# Patient Record
Sex: Female | Born: 1995 | Race: White | Hispanic: No | Marital: Single | Smoking: Former smoker
Health system: Southern US, Community
[De-identification: ages and names within clinical notes are randomized; demographics above are authoritative.]

## PROBLEM LIST (undated history)

## (undated) DIAGNOSIS — R55 Syncope and collapse: Secondary | ICD-10-CM

## (undated) DIAGNOSIS — K5090 Crohn's disease, unspecified, without complications: Secondary | ICD-10-CM

## (undated) DIAGNOSIS — R569 Unspecified convulsions: Secondary | ICD-10-CM

## (undated) DIAGNOSIS — F419 Anxiety disorder, unspecified: Secondary | ICD-10-CM

## (undated) HISTORY — PX: WISDOM TOOTH EXTRACTION: SHX21

## (undated) HISTORY — DX: Syncope and collapse: R55

## (undated) HISTORY — PX: HIP SURGERY: SHX245

---

## 2019-05-21 ENCOUNTER — Emergency Department: Payer: BC Managed Care – PPO

## 2019-05-21 ENCOUNTER — Emergency Department
Admission: EM | Admit: 2019-05-21 | Discharge: 2019-05-21 | Disposition: A | Discharge status: Home / Self Care | Payer: BC Managed Care – PPO | Attending: Emergency Medicine | Admitting: Emergency Medicine

## 2019-05-21 ENCOUNTER — Other Ambulatory Visit: Payer: Self-pay

## 2019-05-21 DIAGNOSIS — F17210 Nicotine dependence, cigarettes, uncomplicated: Secondary | ICD-10-CM | POA: Insufficient documentation

## 2019-05-21 DIAGNOSIS — M25551 Pain in right hip: Secondary | ICD-10-CM | POA: Insufficient documentation

## 2019-05-21 DIAGNOSIS — R569 Unspecified convulsions: Secondary | ICD-10-CM | POA: Insufficient documentation

## 2019-05-21 LAB — CBC WITH DIFFERENTIAL/PLATELET
Abs Immature Granulocytes: 0.01 10*3/uL (ref 0.00–0.07)
Basophils Absolute: 0 10*3/uL (ref 0.0–0.1)
Basophils Relative: 1 %
Eosinophils Absolute: 0.1 10*3/uL (ref 0.0–0.5)
Eosinophils Relative: 2 %
HCT: 34.1 % — ABNORMAL LOW (ref 36.0–46.0)
Hemoglobin: 11.3 g/dL — ABNORMAL LOW (ref 12.0–15.0)
Immature Granulocytes: 0 %
Lymphocytes Relative: 39 %
Lymphs Abs: 1.7 10*3/uL (ref 0.7–4.0)
MCH: 30.8 pg (ref 26.0–34.0)
MCHC: 33.1 g/dL (ref 30.0–36.0)
MCV: 92.9 fL (ref 80.0–100.0)
Monocytes Absolute: 0.5 10*3/uL (ref 0.1–1.0)
Monocytes Relative: 10 %
Neutro Abs: 2.1 10*3/uL (ref 1.7–7.7)
Neutrophils Relative %: 48 %
Platelets: 250 10*3/uL (ref 150–400)
RBC: 3.67 MIL/uL — ABNORMAL LOW (ref 3.87–5.11)
RDW: 12.5 % (ref 11.5–15.5)
WBC: 4.4 10*3/uL (ref 4.0–10.5)
nRBC: 0 % (ref 0.0–0.2)

## 2019-05-21 LAB — BASIC METABOLIC PANEL
Anion gap: 9 (ref 5–15)
BUN: 11 mg/dL (ref 6–20)
CO2: 22 mmol/L (ref 22–32)
Calcium: 8.9 mg/dL (ref 8.9–10.3)
Chloride: 108 mmol/L (ref 98–111)
Creatinine, Ser: 0.78 mg/dL (ref 0.44–1.00)
GFR calc Af Amer: >60 mL/min (ref >60)
GFR calc non Af Amer: >60 mL/min (ref >60)
Glucose, Bld: 80 mg/dL (ref 70–99)
Potassium: 4.2 mmol/L (ref 3.5–5.1)
Sodium: 139 mmol/L (ref 135–145)

## 2019-05-21 LAB — HEPATIC FUNCTION PANEL
ALT: 20 U/L (ref 0–44)
AST: 23 U/L (ref 15–41)
Albumin: 3.7 g/dL (ref 3.5–5.0)
Alkaline Phosphatase: 63 U/L (ref 38–126)
Bilirubin, Direct: <0.1 mg/dL (ref 0.0–0.2)
Total Bilirubin: 0.7 mg/dL (ref 0.3–1.2)
Total Protein: 7.1 g/dL (ref 6.5–8.1)

## 2019-05-21 LAB — URINE DRUG SCREEN, QUALITATIVE (ARMC ONLY)
Amphetamines, Ur Screen: NOT DETECTED
Barbiturates, Ur Screen: NOT DETECTED
Benzodiazepine, Ur Scrn: NOT DETECTED
Cannabinoid 50 Ng, Ur ~~LOC~~: NOT DETECTED
Cocaine Metabolite,Ur ~~LOC~~: NOT DETECTED
MDMA (Ecstasy)Ur Screen: NOT DETECTED
Methadone Scn, Ur: NOT DETECTED
Opiate, Ur Screen: NOT DETECTED
Phencyclidine (PCP) Ur S: NOT DETECTED
Tricyclic, Ur Screen: NOT DETECTED

## 2019-05-21 LAB — URINALYSIS, ROUTINE W REFLEX MICROSCOPIC
Bilirubin Urine: NEGATIVE
Glucose, UA: NEGATIVE mg/dL
Hgb urine dipstick: NEGATIVE
Ketones, ur: NEGATIVE mg/dL
Leukocytes,Ua: NEGATIVE
Nitrite: NEGATIVE
Protein, ur: NEGATIVE mg/dL
Specific Gravity, Urine: 1.016 (ref 1.005–1.030)
pH: 6 (ref 5.0–8.0)

## 2019-05-21 LAB — ETHANOL: Alcohol, Ethyl (B): <10 mg/dL (ref <10)

## 2019-05-21 LAB — POCT PREGNANCY, URINE: Preg Test, Ur: NEGATIVE

## 2019-05-21 LAB — MAGNESIUM: Magnesium: 2.4 mg/dL (ref 1.7–2.4)

## 2019-05-21 MED ORDER — LEVETIRACETAM 500 MG PO TABS: 500.0000 mg | ORAL_TABLET | Freq: Two times a day (BID) | ORAL | 2 refills | Status: DC

## 2019-05-21 NOTE — ED Provider Notes (Signed)
[facility] Emergency Department Provider Note       Time seen: ----------------------------------------- 7:14 AM on 05/21/2019 -----------------------------------------   I have reviewed the triage vital signs and the nursing notes.  HISTORY   Chief Complaint Seizures   HPI Erin Pugh is a 23 y.o. female with no significant past medical history who presents to the ED for a seizure.  Patient arrives by EMS from home, had a possible seizure.  Found on kitchen postictal by her uncle.  She had some right hip pain and was noted to have abrasion on her forehead.  She had 1 seizure 2 years ago after having her wisdom teeth taken out, she was never started on any seizure medications.  History reviewed. No pertinent past medical history.  There are no active problems to display for this patient.   History reviewed. No pertinent surgical history.  Allergies Hydrocodone, Omnicef [cefdinir], and Promethazine  Social History Social History   Tobacco Use  . Smoking status: Current Every Day Smoker    Packs/day: 0.50  . Smokeless tobacco: Never Used  Substance Use Topics  . Alcohol use: Yes  . Drug use: Not Currently   Review of Systems Constitutional: Negative for fever. Cardiovascular: Negative for chest pain. Respiratory: Negative for shortness of breath. Gastrointestinal: Negative for abdominal pain, vomiting and diarrhea. Musculoskeletal: Negative for back pain. Skin: Negative for rash. Neurological: Negative for headaches, focal weakness or numbness.  Positive for seizure  All systems negative/normal/unremarkable except as stated in the HPI  ____________________________________________   PHYSICAL EXAM:  VITAL SIGNS: ED Triage Vitals  Enc Vitals Group     BP 05/21/19 0602 118/82     Pulse Rate 05/21/19 0602 89     Resp 05/21/19 0602 16     Temp 05/21/19 0602 98.3 F (36.8 C)     Temp Source 05/21/19 0602 Oral     SpO2 05/21/19 0602  100 %     Weight 05/21/19 0603 127 lb (57.6 kg)     Height 05/21/19 0603 5\' 4"  (1.626 m)     Head Circumference --      Peak Flow --      Pain Score 05/21/19 0602 0     Pain Loc --      Pain Edu? --      Excl. in GC? --    Constitutional: Alert and oriented. Well appearing and in no distress. Eyes: Conjunctivae are normal. Normal extraocular movements. ENT      Head: Normocephalic and atraumatic.      Nose: No congestion/rhinnorhea.      Mouth/Throat: Mucous membranes are moist.  Right-sided tongue laceration is noted      Neck: No stridor. Cardiovascular: Normal rate, regular rhythm. No murmurs, rubs, or gallops. Respiratory: Normal respiratory effort without tachypnea nor retractions. Breath sounds are clear and equal bilaterally. No wheezes/rales/rhonchi. Gastrointestinal: Soft and nontender. Normal bowel sounds Musculoskeletal: Nontender with normal range of motion in extremities. No lower extremity tenderness nor edema. Neurologic:  Normal speech and language. No gross focal neurologic deficits are appreciated.  Skin:  Skin is warm, dry and intact. No rash noted. Psychiatric: Mood and affect are normal. Speech and behavior are normal.  ____________________________________________  EKG: Interpreted by me.  Sinus rhythm with rate of 90 bpm, normal PR interval, normal QRS, normal QT  ____________________________________________  ED COURSE:  As part of my medical decision making, I reviewed the following data within the electronic MEDICAL RECORD NUMBER History obtained from family if  available, nursing notes, old chart and ekg, as well as notes from prior ED visits. Patient presented for a seizure, we will assess with labs and imaging as indicated at this time.   Procedures  Erin Pugh was evaluated in Emergency Department on 05/21/2019 for the symptoms described in the history of present illness. She was evaluated in the context of the global COVID-19 pandemic, which necessitated  consideration that the patient might be at risk for infection with the SARS-CoV-2 virus that causes COVID-19. Institutional protocols and algorithms that pertain to the evaluation of patients at risk for COVID-19 are in a state of rapid change based on information released by regulatory bodies including the CDC and federal and state organizations. These policies and algorithms were followed during the patient's care in the ED.  ____________________________________________   LABS (pertinent positives/negatives)  Labs Reviewed  CBC WITH DIFFERENTIAL/PLATELET - Abnormal; Notable for the following components:      Result Value   RBC 3.67 (*)    Hemoglobin 11.3 (*)    HCT 34.1 (*)    All other components within normal limits  URINALYSIS, ROUTINE W REFLEX MICROSCOPIC - Abnormal; Notable for the following components:   Color, Urine YELLOW (*)    APPearance HAZY (*)    All other components within normal limits  BASIC METABOLIC PANEL  ETHANOL  URINE DRUG SCREEN, QUALITATIVE ([facility] ONLY)  MAGNESIUM  HEPATIC FUNCTION PANEL  POC URINE PREG, ED  POCT PREGNANCY, URINE    RADIOLOGY  CT head Was unremarkable Right hip x-ray was unremarkable ____________________________________________   DIFFERENTIAL DIAGNOSIS   Seizure, seizure-like activity, dehydration, electrolyte abnormality, occult infection  FINAL ASSESSMENT AND PLAN  Seizure   Plan: The patient had presented for a seizure that occurred at home, 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 Patient's labs do not reveal any acute process. Patient's imaging was reassuring.  Given this is her second seizure I will start her on Keppra and refer her to neurology for outpatient follow-up.  She understands she is not to be driving until follow-up.   Laurence Aly, MD    Note: This note was generated in part or whole with voice recognition software. Voice recognition is usually quite accurate but there are transcription errors that can and very  often do occur. I apologize for any typographical errors that were not detected and corrected.     Earleen Newport, MD 05/21/19 [phone]

## 2019-05-21 NOTE — ED Notes (Signed)
PT RETURNED FROM CT

## 2019-05-21 NOTE — ED Notes (Signed)
Pt up to the toilet, c/o right hip pain, able to ambulate but with pain.

## 2019-05-21 NOTE — ED Triage Notes (Signed)
Pt to ED via EMS from home. Per ems pt had possible seizure, pt was found by uncle in kitchen post ictal. Pt arrives c/o right hip pain and has small hematoma to left forehead. Pt has had 1 seizure 40yrs ago after having wisdom teeth taken out. Never started on any seizure medications. Pt denies any new medication use. Pt does state that she was drinking some last night but not enough to make her feel drunk. VSS. NAD.

## 2019-05-21 NOTE — ED Notes (Signed)
Patient transported to CT 

## 2020-01-11 ENCOUNTER — Encounter: Payer: Self-pay | Admitting: *Deleted

## 2020-01-11 ENCOUNTER — Emergency Department: Payer: BC Managed Care – PPO

## 2020-01-11 ENCOUNTER — Emergency Department
Admission: EM | Admit: 2020-01-11 | Discharge: 2020-01-11 | Disposition: A | Discharge status: Home / Self Care | Payer: BC Managed Care – PPO | Attending: Emergency Medicine | Admitting: Emergency Medicine

## 2020-01-11 ENCOUNTER — Other Ambulatory Visit: Payer: Self-pay

## 2020-01-11 DIAGNOSIS — Y929 Unspecified place or not applicable: Secondary | ICD-10-CM | POA: Insufficient documentation

## 2020-01-11 DIAGNOSIS — W1839XA Other fall on same level, initial encounter: Secondary | ICD-10-CM | POA: Diagnosis not present

## 2020-01-11 DIAGNOSIS — Y9389 Activity, other specified: Secondary | ICD-10-CM | POA: Insufficient documentation

## 2020-01-11 DIAGNOSIS — F17210 Nicotine dependence, cigarettes, uncomplicated: Secondary | ICD-10-CM | POA: Insufficient documentation

## 2020-01-11 DIAGNOSIS — S0003XA Contusion of scalp, initial encounter: Secondary | ICD-10-CM | POA: Diagnosis not present

## 2020-01-11 DIAGNOSIS — Z79899 Other long term (current) drug therapy: Secondary | ICD-10-CM | POA: Diagnosis not present

## 2020-01-11 DIAGNOSIS — R569 Unspecified convulsions: Secondary | ICD-10-CM | POA: Insufficient documentation

## 2020-01-11 DIAGNOSIS — Y999 Unspecified external cause status: Secondary | ICD-10-CM | POA: Diagnosis not present

## 2020-01-11 HISTORY — DX: Unspecified convulsions: R56.9

## 2020-01-11 LAB — POC URINE PREG, ED: Preg Test, Ur: NEGATIVE

## 2020-01-11 LAB — BASIC METABOLIC PANEL
Anion gap: 3 — ABNORMAL LOW (ref 5–15)
BUN: 11 mg/dL (ref 6–20)
CO2: 26 mmol/L (ref 22–32)
Calcium: 8.4 mg/dL — ABNORMAL LOW (ref 8.9–10.3)
Chloride: 107 mmol/L (ref 98–111)
Creatinine, Ser: 0.69 mg/dL (ref 0.44–1.00)
GFR calc Af Amer: >60 mL/min (ref >60)
GFR calc non Af Amer: >60 mL/min (ref >60)
Glucose, Bld: 86 mg/dL (ref 70–99)
Potassium: 4.4 mmol/L (ref 3.5–5.1)
Sodium: 136 mmol/L (ref 135–145)

## 2020-01-11 NOTE — ED Triage Notes (Signed)
Patient arrives via [facility] with c/o seizure. Per medic report, the patient was at work and had a witnessed "full body seizure" by her coworkers. HX of seizures. Pt has been alert and oriented en route. She c/o pain in her head and buttocks. IV established. 4 Zofran given. CBG 120. VSS   Patient says she has had a seizure within the past year, but none since starting her Lamictal. Says that she does take her medications as prescribed.

## 2020-01-11 NOTE — ED Notes (Signed)
Pt transported to CT ?

## 2020-01-11 NOTE — ED Notes (Signed)
Dr Jessup at bedside 

## 2020-01-11 NOTE — ED Provider Notes (Signed)
[facility] Emergency Department Provider Note   ____________________________________________   First MD Initiated Contact with Patient 01/11/20 0700     (approximate)  I have reviewed the triage vital signs and the nursing notes.   HISTORY  Chief Complaint Seizures    HPI Erin Pugh is a 24 y.o. female with possible history of seizures who presents to the ED following seizure. Per EMS, patient was at work when she had a witnessed "full body seizure" by her coworkers. Seizure lasted no more than a couple minutes and patient has since returned to her baseline mental status. She reports a history of seizures in the past, currently takes Lamictal and has not missed any doses. She had already taken her morning dose of Lamictal and had otherwise been feeling well at work. She denies any recent fevers, cough, chest pain, shortness of breath, dysuria, or hematuria. She denies any alcohol or drug abuse. Her LMP was about 2 weeks ago. She does complain of a headache where she has some swelling to her right frontal scalp as well as some pain around her right buttock.        Past Medical History:  Diagnosis Date  . Seizures ([place])     There are no problems to display for this patient.   Past Surgical History:  Procedure Laterality Date  . HIP SURGERY      Prior to Admission medications   Medication Sig Start Date End Date Taking? Authorizing Provider  escitalopram (LEXAPRO) 10 MG tablet Take 10 mg by mouth at bedtime. 12/23/19  Yes [provider]  lamoTRIgine (LAMICTAL) 25 MG tablet Take 25 mg by mouth 2 (two) times daily. 12/23/19  Yes [provider]  mesalamine (APRISO) 0.375 g 24 hr capsule Take 1.5 g by mouth daily. 12/25/19  Yes [provider]  MICROGESTIN 1-20 MG-MCG tablet Take 1 tablet by mouth daily. 12/23/19  Yes [provider]  valACYclovir (VALTREX) 1000 MG tablet Take 1,000 mg by mouth daily. 12/23/19  Yes  [provider]    Allergies Hydrocodone, Omnicef [cefdinir], and Promethazine  No family history on file.  Social History Social History   Tobacco Use  . Smoking status: Current Every Day Smoker    Packs/day: 0.50  . Smokeless tobacco: Never Used  Substance Use Topics  . Alcohol use: Yes  . Drug use: Not Currently    Review of Systems  Constitutional: No fever/chills Eyes: No visual changes. ENT: No sore throat. Cardiovascular: Denies chest pain. Respiratory: Denies shortness of breath. Gastrointestinal: No abdominal pain.  No nausea, no vomiting.  No diarrhea.  No constipation. Genitourinary: Negative for dysuria. Musculoskeletal: Negative for back pain. Skin: Negative for rash. Neurological: Positive for headaches, negative for focal weakness or numbness.  Positive for seizure.  ____________________________________________   PHYSICAL EXAM:  VITAL SIGNS: ED Triage Vitals  Enc Vitals Group     BP 01/11/20 0646 118/75     Pulse Rate 01/11/20 0646 100     Resp 01/11/20 0646 16     Temp 01/11/20 0646 98.1 F (36.7 C)     Temp Source 01/11/20 0646 Oral     SpO2 01/11/20 0646 100 %     Weight 01/11/20 0642 134 lb (60.8 kg)     Height 01/11/20 0642 5\' 4"  (1.626 m)     Head Circumference --      Peak Flow --      Pain Score 01/11/20 0641 7     Pain  Loc --      Pain Edu? --      Excl. in GC? --     Constitutional: Alert and oriented. Eyes: Conjunctivae are normal. Head: Small right-sided frontal scalp hematoma. Nose: No congestion/rhinnorhea. Mouth/Throat: Mucous membranes are moist. Neck: Normal ROM, no midline cervical spine tenderness. Cardiovascular: Normal rate, regular rhythm. Grossly normal heart sounds. Respiratory: Normal respiratory effort.  No retractions. Lungs CTAB. Gastrointestinal: Soft and nontender. No distention. Genitourinary: deferred Musculoskeletal: No lower extremity tenderness nor edema.  No midline spinal tenderness or hip  tenderness. Neurologic:  Normal speech and language. No gross focal neurologic deficits are appreciated. Skin:  Skin is warm, dry and intact. No rash noted. Psychiatric: Mood and affect are normal. Speech and behavior are normal.  ____________________________________________   LABS (all labs ordered are listed, but only abnormal results are displayed)  Labs Reviewed  BASIC METABOLIC PANEL - Abnormal; Notable for the following components:      Result Value   Calcium 8.4 (*)    Anion gap 3 (*)    All other components within normal limits  POC URINE PREG, ED     PROCEDURES  Procedure(s) performed (including Critical Care):  Procedures   ____________________________________________   INITIAL IMPRESSION / ASSESSMENT AND PLAN / ED COURSE       24 year old female with history of seizures on the [place] presents to the ED complaining of witnessed seizure at work earlier this morning.  She has now awake and alert, answering questions appropriately, does complain of a headache and pain to her right buttock.  She has a small hematoma to her right frontal scalp and we will assess for any traumatic injury with CT head.  Able to clear C-spine clinically.  She likely has some muscle soreness to her right buttock as she has no spinal or hip tenderness, full range of motion intact to bilateral lower extremities.  BMP is unremarkable, pregnancy testing is negative.  This likely represents breakthrough seizure as patient has been compliant with her Lamictal, already took a dose this morning.  We will observe in the ED for any recurrent seizure, otherwise she will be appropriate for discharge home with neurology follow-up.  CT head is negative for acute process, pregnancy testing is negative.  Patient sleeping comfortably at this time and has remained seizure-free here in the ED.  Patient is appropriate for discharge home and was counseled to follow-up with neurology, otherwise return to the ED for  new or worsening symptoms.  Patient agrees with plan.      ____________________________________________   FINAL CLINICAL IMPRESSION(S) / ED DIAGNOSES  Final diagnoses:  Seizure [facility])     ED Discharge Orders    None       Note:  This document was prepared using Dragon voice recognition software and may include unintentional dictation errors.   Chesley Noon, MD 01/11/20 [phone]

## 2020-09-28 IMAGING — CT CT HEAD W/O CM
4 series · 16 of 47 positions shown, 18 images · non-contrast
Comparison: Head CT 05/21/2019.

CLINICAL DATA: 23-year-old female with history of seizure. Head
trauma.

EXAM:
CT HEAD WITHOUT CONTRAST
TECHNIQUE: Contiguous axial images were obtained from the base of the skull
through the vertex without intravenous contrast.

[Series 2: head wo · axial · 0.41mm/px · z∈[-156,-46]mm · 7 of 30 slices shown, 9 images]
[im 4/30  brain]
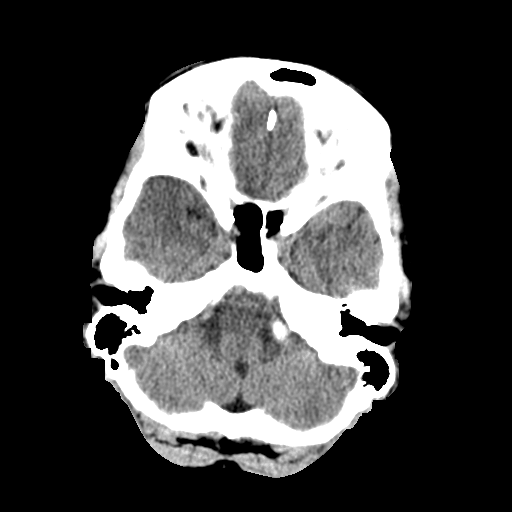
[im 4/30  bone]
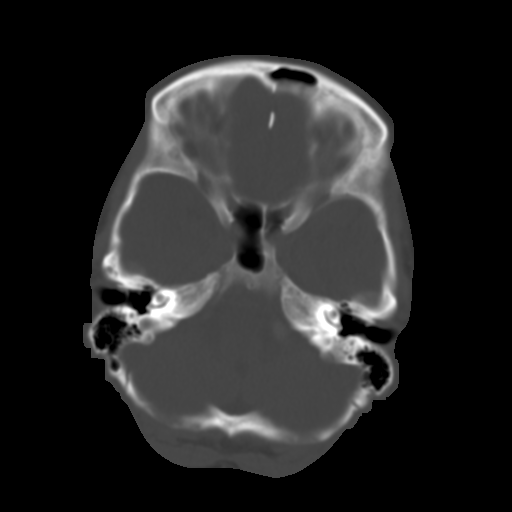
[im 8/30  brain]
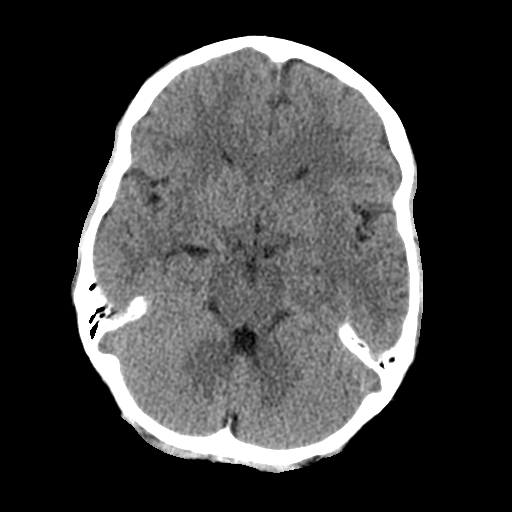
[im 11/30  brain]
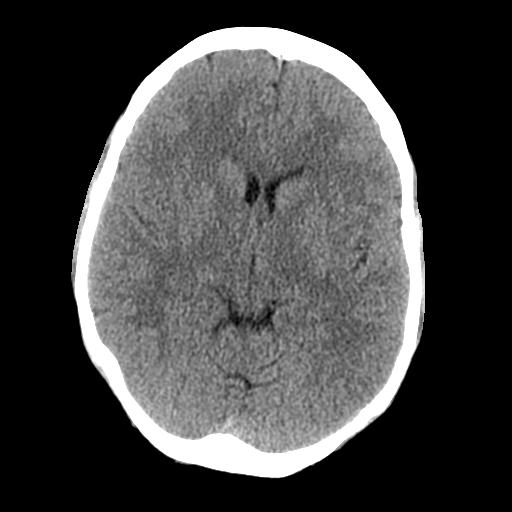
[im 15/30  brain]
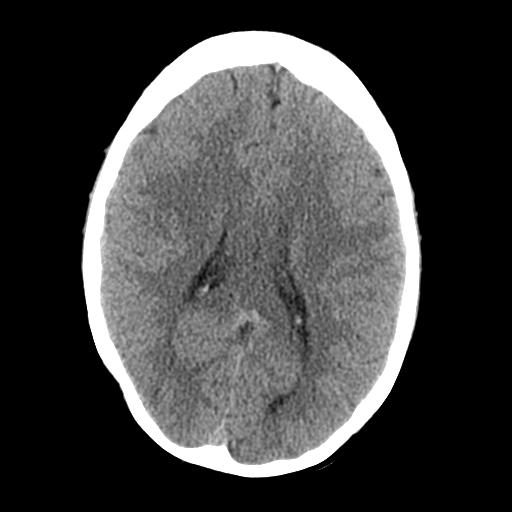
[im 19/30  brain]
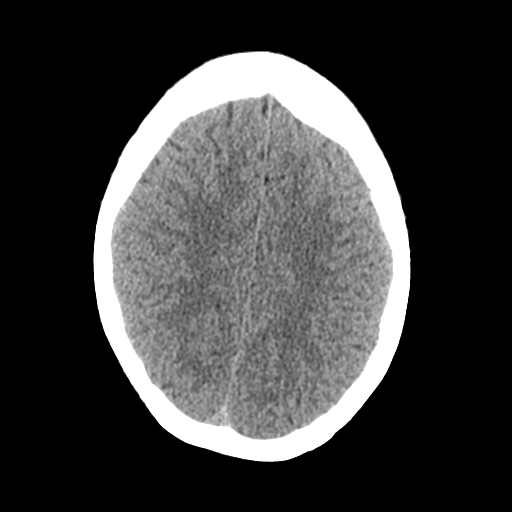
[im 19/30  bone]
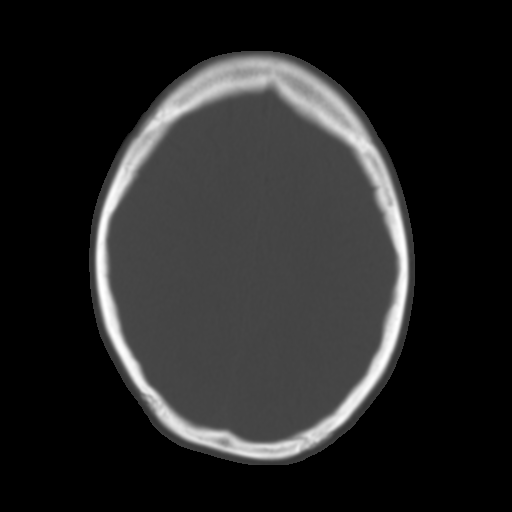
[im 22/30  brain]
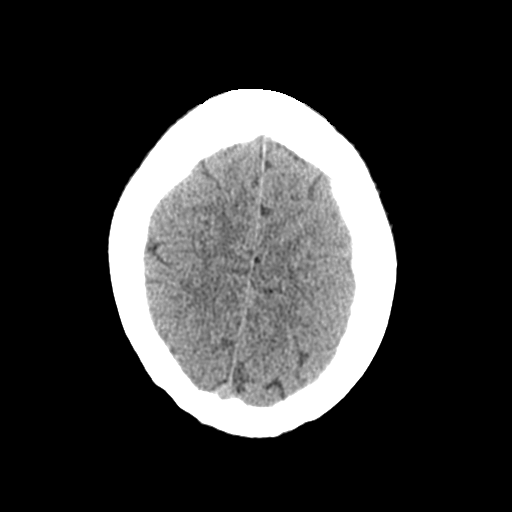
[im 26/30  brain]
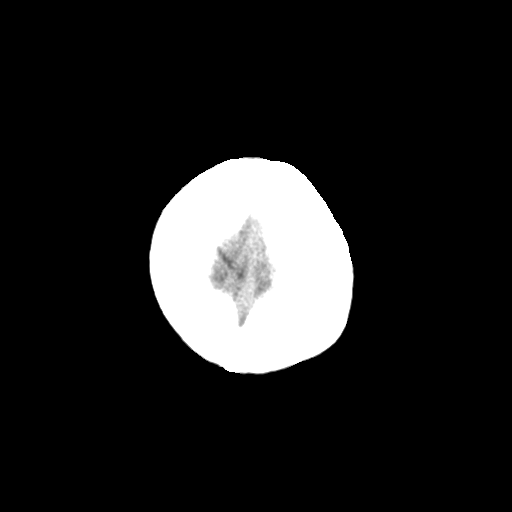

[Series 3: head bone · axial · 0.41mm/px · z∈[-157,-127]mm · 3 of 75 slices shown]
[im 8/75  bone]
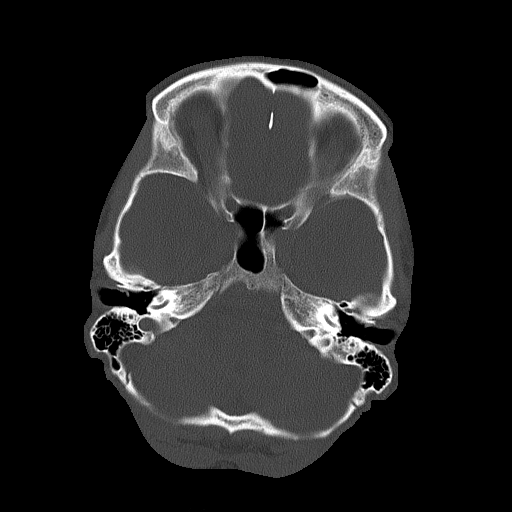
[im 15/75  bone]
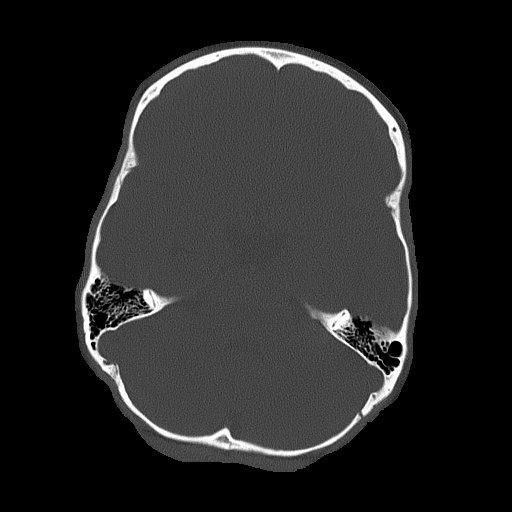
[im 23/75  bone]
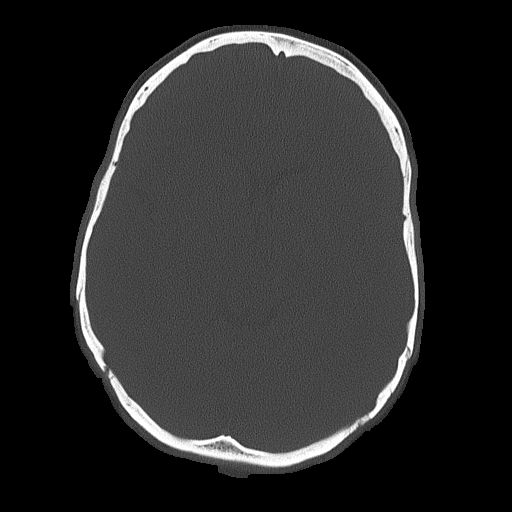

[Series 4: coronal soft tissue · coronal · 0.28mm/px · 3 of 68 slices shown]
[im 23/68  brain]
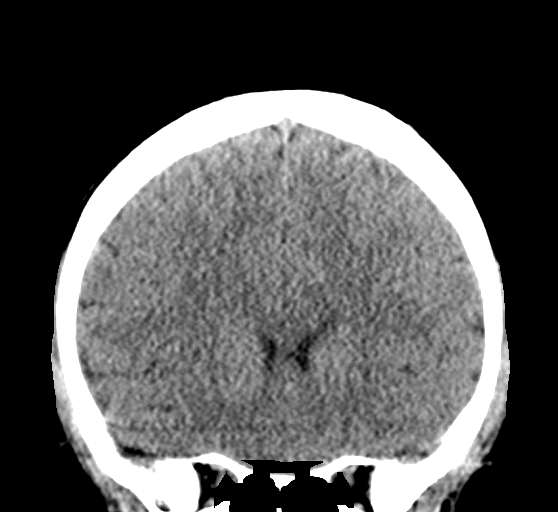
[im 30/68  brain]
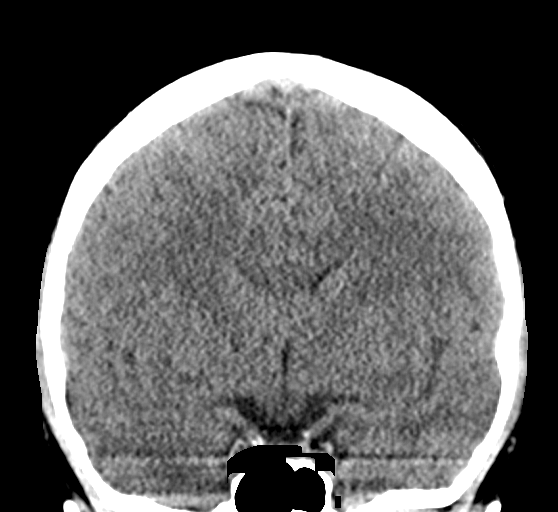
[im 38/68  brain]
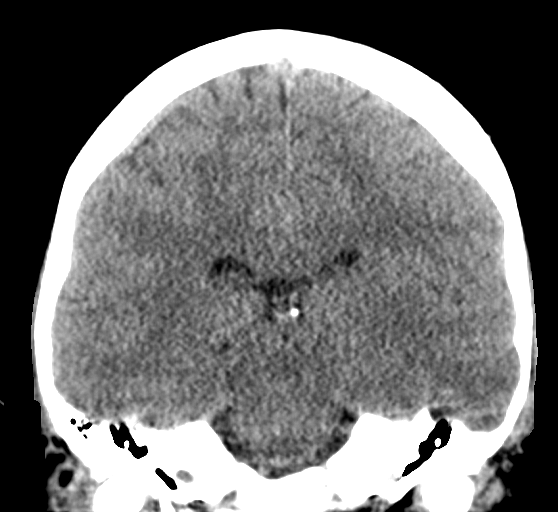

[Series 5: sagittal soft tissue · sagittal · 0.28mm/px · 3 of 52 slices shown]
[im 18/52  brain]
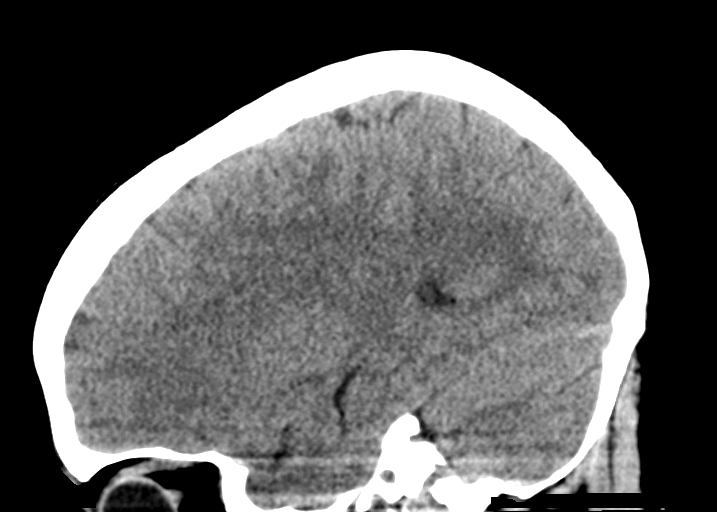
[im 26/52  brain]
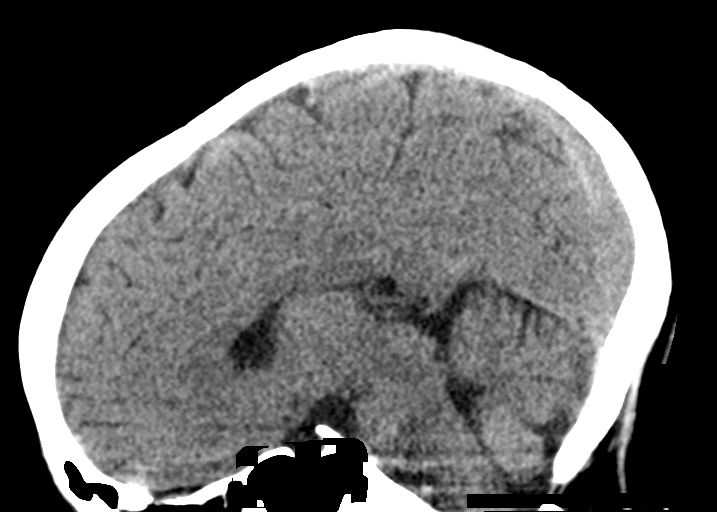
[im 35/52  brain]
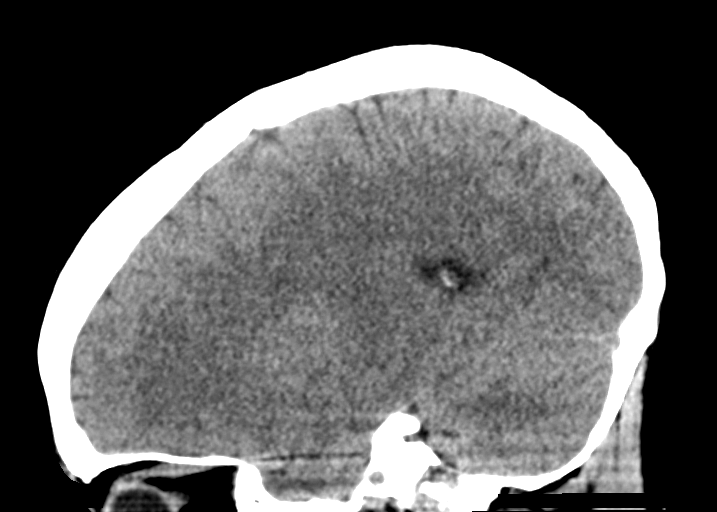

[16 of 47 positions shown; findings below may reference images not displayed]

FINDINGS: Brain: No evidence of acute infarction, hemorrhage, hydrocephalus,
extra-axial collection or mass lesion/mass effect.

Vascular: No hyperdense vessel or unexpected calcification.

Skull: Normal. Negative for fracture or focal lesion.

Sinuses/Orbits: No acute finding.

Other: None.
IMPRESSION: 1. No acute intracranial abnormalities. The appearance of the brain
is normal.

## 2020-10-15 ENCOUNTER — Other Ambulatory Visit: Payer: Self-pay

## 2020-10-15 ENCOUNTER — Other Ambulatory Visit
Admission: RE | Admit: 2020-10-15 | Discharge: 2020-10-15 | Disposition: A | Discharge status: Home / Self Care | Payer: BC Managed Care – PPO | Source: Ambulatory Visit | Attending: Internal Medicine | Admitting: Internal Medicine

## 2020-10-15 DIAGNOSIS — Z20822 Contact with and (suspected) exposure to covid-19: Secondary | ICD-10-CM | POA: Diagnosis not present

## 2020-10-15 DIAGNOSIS — Z01812 Encounter for preprocedural laboratory examination: Secondary | ICD-10-CM | POA: Insufficient documentation

## 2020-10-16 ENCOUNTER — Encounter: Payer: Self-pay | Admitting: Internal Medicine

## 2020-10-16 LAB — SARS CORONAVIRUS 2 (TAT 6-24 HRS): SARS Coronavirus 2: NEGATIVE

## 2020-10-19 ENCOUNTER — Ambulatory Visit
Admission: RE | Admit: 2020-10-19 | Discharge: 2020-10-19 | Disposition: A | Discharge status: Home / Self Care | Payer: BC Managed Care – PPO | Attending: Internal Medicine | Admitting: Internal Medicine

## 2020-10-19 ENCOUNTER — Ambulatory Visit: Payer: BC Managed Care – PPO | Admitting: Certified Registered Nurse Anesthetist

## 2020-10-19 ENCOUNTER — Encounter
Admission: RE | Disposition: A | Discharge status: Home / Self Care | Payer: Self-pay | Source: Home / Self Care | Attending: Internal Medicine

## 2020-10-19 ENCOUNTER — Other Ambulatory Visit: Payer: Self-pay

## 2020-10-19 DIAGNOSIS — Z79899 Other long term (current) drug therapy: Secondary | ICD-10-CM | POA: Diagnosis not present

## 2020-10-19 DIAGNOSIS — R197 Diarrhea, unspecified: Secondary | ICD-10-CM | POA: Insufficient documentation

## 2020-10-19 DIAGNOSIS — R1013 Epigastric pain: Secondary | ICD-10-CM | POA: Insufficient documentation

## 2020-10-19 HISTORY — DX: Anxiety disorder, unspecified: F41.9

## 2020-10-19 HISTORY — DX: Crohn's disease, unspecified, without complications: K50.90

## 2020-10-19 HISTORY — PX: ESOPHAGOGASTRODUODENOSCOPY (EGD) WITH PROPOFOL: SHX5813

## 2020-10-19 HISTORY — PX: COLONOSCOPY WITH PROPOFOL: SHX5780

## 2020-10-19 LAB — POCT PREGNANCY, URINE: Preg Test, Ur: NEGATIVE

## 2020-10-19 SURGERY — ESOPHAGOGASTRODUODENOSCOPY (EGD) WITH PROPOFOL
Anesthesia: General

## 2020-10-19 MED ORDER — MIDAZOLAM HCL 2 MG/2ML IJ SOLN
INTRAMUSCULAR | Status: DC | PRN
  Administered 2020-10-19 (×2): 1 mg via INTRAVENOUS

## 2020-10-19 MED ORDER — SODIUM CHLORIDE 0.9 % IV SOLN: INTRAVENOUS | Status: DC | PRN

## 2020-10-19 MED ORDER — PROPOFOL 10 MG/ML IV BOLUS
INTRAVENOUS | Status: DC | PRN
  Administered 2020-10-19: 50 mg via INTRAVENOUS
  Administered 2020-10-19: 20 mg via INTRAVENOUS
  Administered 2020-10-19: 70 mg via INTRAVENOUS

## 2020-10-19 MED ORDER — MIDAZOLAM HCL 2 MG/2ML IJ SOLN
INTRAMUSCULAR | Status: AC
  Filled 2020-10-19: qty 2

## 2020-10-19 MED ORDER — PROPOFOL 500 MG/50ML IV EMUL
INTRAVENOUS | Status: AC
  Filled 2020-10-19: qty 50

## 2020-10-19 MED ORDER — LIDOCAINE HCL (CARDIAC) PF 100 MG/5ML IV SOSY
PREFILLED_SYRINGE | INTRAVENOUS | Status: DC | PRN
  Administered 2020-10-19: 50 mg via INTRAVENOUS

## 2020-10-19 MED ORDER — PROPOFOL 500 MG/50ML IV EMUL
INTRAVENOUS | Status: DC | PRN
  Administered 2020-10-19: 150 ug/kg/min via INTRAVENOUS

## 2020-10-19 NOTE — Transfer of Care (Signed)
Immediate Anesthesia Transfer of Care Note  Patient: Erin Pugh  Procedure(s) Performed: ESOPHAGOGASTRODUODENOSCOPY (EGD) WITH PROPOFOL (N/A ) COLONOSCOPY WITH PROPOFOL (N/A )  Patient Location: PACU  Anesthesia Type:General  Level of Consciousness: awake, alert  and oriented  Airway & Oxygen Therapy: Patient Spontanous Breathing  Post-op Assessment: Report given to RN and Post -op Vital signs reviewed and stable  Post vital signs: Reviewed and stable  Last Vitals:  Vitals Value Taken Time  BP 118/79 10/19/20 1400  Temp    Pulse 68 10/19/20 1400  Resp 23 10/19/20 1400  SpO2 100 % 10/19/20 1400  Vitals shown include unvalidated device data.  Last Pain: There were no vitals filed for this visit.    Patients Stated Pain Goal: 0 (10/19/20 1241)  Complications: No complications documented.

## 2020-10-19 NOTE — Op Note (Signed)
[facility] Gastroenterology Patient Name: Erin Pugh Procedure Date: 10/19/2020 1:13 PM MRN: [id number] Account #: [id number] Date of Birth: 1996-07-30 Admit Type: Outpatient Age: 25 Room: [facility] ENDO ROOM 2 Gender: Female Note Status: Finalized Procedure:             Colonoscopy Indications:           Diarrhea (presumed secondary to Crohn's disease),                         Suspected Crohn's disease of the small bowel and colon Providers:             Boykin Nearing. Emani Morad MD, MD Medicines:             Propofol per Anesthesia Complications:         No immediate complications. Procedure:             Pre-Anesthesia Assessment:                        - The risks and benefits of the procedure and the                         sedation options and risks were discussed with the                         patient. All questions were answered and informed                         consent was obtained.                        - Patient identification and proposed procedure were                         verified prior to the procedure by the nurse. The                         procedure was verified in the procedure room.                        - ASA Grade Assessment: II - A patient with mild                         systemic disease.                        - After reviewing the risks and benefits, the patient                         was deemed in satisfactory condition to undergo the                         procedure.                        After obtaining informed consent, the colonoscope was                         passed under direct vision. Throughout the procedure,  the patient's blood pressure, pulse, and oxygen                         saturations were monitored continuously. The                         Colonoscope was introduced through the anus and                         advanced to the the terminal ileum, with                         identification of  the appendiceal orifice and IC                         valve. The colonoscopy was performed without                         difficulty. The patient tolerated the procedure well.                         The quality of the bowel preparation was excellent.                         The terminal ileum, ileocecal valve, appendiceal                         orifice, and rectum were photographed. Findings:      The perianal and digital rectal examinations were normal. Pertinent       negatives include normal sphincter tone and no palpable rectal lesions.      The terminal ileum appeared normal. Biopsies were taken with a cold       forceps for histology.      The colon (entire examined portion) appeared normal. Biopsies for       histology were taken with a cold forceps from the right colon, left       colon and rectum for evaluation of microscopic colitis. Impression:            - The examined portion of the ileum was normal.                         Biopsied.                        - The entire examined colon is normal. Biopsied. Recommendation:        - Patient has a contact number available for                         emergencies. The signs and symptoms of potential                         delayed complications were discussed with the patient.                         Return to normal activities tomorrow. Written                         discharge instructions were provided to the patient.                        -  Resume previous diet.                        - Continue present medications.                        - Await pathology results.                        - Return to physician assistant in 6 weeks.                        - The findings and recommendations were discussed with                         the patient. Procedure Code(s):     --- Professional ---                        [phone], Colonoscopy, flexible; with biopsy, single or                         multiple Diagnosis Code(s):     ---  Professional ---                        R19.7, Diarrhea, unspecified CPT copyright 2019 American Medical Association. All rights reserved. The codes documented in this report are preliminary and upon coder review may  be revised to meet current compliance requirements. Stanton Kidney MD, MD 10/19/2020 1:59:48 PM This report has been signed electronically. Number of Addenda: 0 Note Initiated On: 10/19/2020 1:13 PM Scope Withdrawal Time: 0 hours 5 minutes 27 seconds  Total Procedure Duration: 0 hours 9 minutes 29 seconds  Estimated Blood Loss:  Estimated blood loss: none.      [facility]

## 2020-10-19 NOTE — Anesthesia Procedure Notes (Signed)
Date/Time: 10/19/2020 1:31 PM Performed by: Ginger Carne, CRNA Pre-anesthesia Checklist: Patient identified, Emergency Drugs available, Suction available, Patient being monitored and Timeout performed Patient Re-evaluated:Patient Re-evaluated prior to induction Oxygen Delivery Method: Nasal cannula Preoxygenation: Pre-oxygenation with 100% oxygen Induction Type: IV induction

## 2020-10-19 NOTE — Interval H&P Note (Signed)
History and Physical Interval Note:  10/19/2020 1:32 PM  Erin Pugh  has presented today for surgery, with the diagnosis of crohn's; GERD.  The various methods of treatment have been discussed with the patient and family. After consideration of risks, benefits and other options for treatment, the patient has consented to  Procedure(s): ESOPHAGOGASTRODUODENOSCOPY (EGD) WITH PROPOFOL (N/A) COLONOSCOPY WITH PROPOFOL (N/A) as a surgical intervention.  The patient's history has been reviewed, patient examined, no change in status, stable for surgery.  I have reviewed the patient's chart and labs.  Questions were answered to the patient's satisfaction.     [place], [place]

## 2020-10-19 NOTE — H&P (Signed)
Outpatient short stay form Pre-procedure 10/19/2020 1:29 PM Erin Pugh K. Norma Fredrickson, M.D.  Primary Physician:  N/A  Reason for visit:  Crohn's disease, GERD, bloating  History of present illness: Patient is a 25 y/o female with hx of Crohn's disease reportedly diagnosed c. 2019. She is taking Apriso but continues to have symptoms of loose stool 2-8 x per day with occasional BRBPR attributed to "hemorrhoids". No significant weight loss, abdominal pain, nausea or vomiting.   No current facility-administered medications for this encounter.  Medications Prior to Admission  Medication Sig Dispense Refill Last Dose  . dicyclomine (BENTYL) 10 MG capsule Take 10 mg by mouth 3 (three) times daily before meals.     . lamoTRIgine (LAMICTAL) 200 MG tablet Take 200 mg by mouth daily. 1 tab am and 1.5 tabs at noc   10/19/2020 at 0900  . escitalopram (LEXAPRO) 10 MG tablet Take 10 mg by mouth at bedtime.     . lamoTRIgine (LAMICTAL) 25 MG tablet Take 25 mg by mouth 2 (two) times daily.     . mesalamine (APRISO) 0.375 g 24 hr capsule Take 1.5 g by mouth daily.     Marland Kitchen MICROGESTIN 1-20 MG-MCG tablet Take 1 tablet by mouth daily.     . valACYclovir (VALTREX) 1000 MG tablet Take 1,000 mg by mouth daily.        Allergies  Allergen Reactions  . Hydrocodone Itching  . Keppra [Levetiracetam]   . Omnicef [Cefdinir]   . Promethazine      Past Medical History:  Diagnosis Date  . Anxiety   . Crohn's disease ([facility])   . Seizures ([facility])     Review of systems:  Otherwise negative.    Physical Exam  Gen: Alert, oriented. Appears stated age.  HEENT: Force/AT. PERRLA. Lungs: CTA, no wheezes. CV: RR nl S1, S2. Abd: soft, benign, no masses. BS+ Ext: No edema. Pulses 2+    Planned procedures: Proceed with EGD and colonoscopy. The patient understands the nature of the planned procedure, indications, risks, alternatives and potential complications including but not limited to bleeding, infection, perforation,  damage to internal organs and possible oversedation/side effects from anesthesia. The patient agrees and gives consent to proceed.  Please refer to procedure notes for findings, recommendations and patient disposition/instructions.     Jermiyah Ricotta K. Norma Fredrickson, M.D. Gastroenterology 10/19/2020  1:29 PM

## 2020-10-19 NOTE — Anesthesia Preprocedure Evaluation (Signed)
Anesthesia Evaluation  Patient identified by MRN, date of birth, ID band Patient awake    Reviewed: Allergy & Precautions, NPO status , Patient's Chart, lab work & pertinent test results  History of Anesthesia Complications Negative for: history of anesthetic complications  Airway Mallampati: II       Dental   Pulmonary neg sleep apnea, neg COPD, Current Smoker,           Cardiovascular (-) hypertension(-) Past MI and (-) CHF (-) dysrhythmias (-) Valvular Problems/Murmurs     Neuro/Psych Seizures - (last one in 9/21), Well Controlled,  Anxiety    GI/Hepatic Neg liver ROS, GERD  ,  Endo/Other  neg diabetes  Renal/GU negative Renal ROS     Musculoskeletal   Abdominal   Peds  Hematology   Anesthesia Other Findings   Reproductive/Obstetrics                             Anesthesia Physical Anesthesia Plan  ASA: III  Anesthesia Plan: General   Post-op Pain Management:    Induction: Intravenous  PONV Risk Score and Plan: 2 and Propofol infusion and TIVA  Airway Management Planned: Nasal Cannula  Additional Equipment:   Intra-op Plan:   Post-operative Plan:   Informed Consent: I have reviewed the patients History and Physical, chart, labs and discussed the procedure including the risks, benefits and alternatives for the proposed anesthesia with the patient or authorized representative who has indicated his/her understanding and acceptance.       Plan Discussed with:   Anesthesia Plan Comments:         Anesthesia Quick Evaluation

## 2020-10-19 NOTE — Op Note (Signed)
[facility] Patient Name: Erin Pugh Procedure Date: 10/19/2020 1:14 PM MRN: [id number] Account #: [id number] Date of Birth: 1996/04/21 Admit Type: Outpatient Age: 24 Room: [facility] ENDO ROOM 2 Gender: Female Note Status: Finalized Procedure:             Upper GI endoscopy Indications:           Dyspepsia, Suspected esophageal reflux Providers:             Boykin Nearing. Telia Amundson MD, MD Medicines:             Propofol per Anesthesia Complications:         No immediate complications. Procedure:             Pre-Anesthesia Assessment:                        - The risks and benefits of the procedure and the                         sedation options and risks were discussed with the                         patient. All questions were answered and informed                         consent was obtained.                        - Patient identification and proposed procedure were                         verified prior to the procedure by the nurse. The                         procedure was verified in the procedure room.                        - ASA Grade Assessment: III - A patient with severe                         systemic disease.                        - After reviewing the risks and benefits, the patient                         was deemed in satisfactory condition to undergo the                         procedure.                        After obtaining informed consent, the endoscope was                         passed under direct vision. Throughout the procedure,                         the patient's blood pressure, pulse, and oxygen  saturations were monitored continuously. The Endoscope                         was introduced through the mouth, and advanced to the                         third part of duodenum. The upper GI endoscopy was                         accomplished without difficulty. The patient tolerated                          the procedure well. Findings:      The esophagus was normal.      The stomach was normal.      The examined duodenum was normal. Impression:            - Normal esophagus.                        - Normal stomach.                        - Normal examined duodenum.                        - No specimens collected. Recommendation:        - Proceed with colonoscopy Procedure Code(s):     --- Professional ---                        [phone], Esophagogastroduodenoscopy, flexible,                         transoral; diagnostic, including collection of                         specimen(s) by brushing or washing, when performed                         (separate procedure) Diagnosis Code(s):     --- Professional ---                        R10.13, Epigastric pain CPT copyright 2019 American Medical Association. All rights reserved. The codes documented in this report are preliminary and upon coder review may  be revised to meet current compliance requirements. Stanton Kidney MD, MD 10/19/2020 1:41:19 PM This report has been signed electronically. Number of Addenda: 0 Note Initiated On: 10/19/2020 1:14 PM Estimated Blood Loss:  Estimated blood loss: none.      [facility]

## 2020-10-20 ENCOUNTER — Encounter: Payer: Self-pay | Admitting: Internal Medicine

## 2020-10-20 NOTE — Anesthesia Postprocedure Evaluation (Signed)
Anesthesia Post Note  Patient: Erin Pugh  Procedure(s) Performed: ESOPHAGOGASTRODUODENOSCOPY (EGD) WITH PROPOFOL (N/A ) COLONOSCOPY WITH PROPOFOL (N/A )  Patient location during evaluation: PACU Anesthesia Type: General Level of consciousness: awake and alert Pain management: pain level controlled Vital Signs Assessment: post-procedure vital signs reviewed and stable Respiratory status: spontaneous breathing, nonlabored ventilation and respiratory function stable Cardiovascular status: blood pressure returned to baseline and stable Postop Assessment: no apparent nausea or vomiting Anesthetic complications: no   No complications documented.   Last Vitals:  Vitals:   10/19/20 1410 10/19/20 1420  BP: 110/78 103/78  Pulse: 71 73  Resp: 20 20  Temp:    SpO2: 100% 100%    Last Pain:  Vitals:   10/19/20 1420  TempSrc:   PainSc: 0-No pain                 Aurelio Brash Elray Dains

## 2020-10-21 LAB — SURGICAL PATHOLOGY

## 2022-07-05 ENCOUNTER — Emergency Department (HOSPITAL_COMMUNITY): Payer: BC Managed Care – PPO

## 2022-07-05 ENCOUNTER — Encounter (HOSPITAL_COMMUNITY)
Admission: EM | Disposition: A | Discharge status: Home / Self Care | Payer: Self-pay | Source: Home / Self Care | Attending: Emergency Medicine

## 2022-07-05 ENCOUNTER — Other Ambulatory Visit: Payer: Self-pay

## 2022-07-05 ENCOUNTER — Encounter (HOSPITAL_COMMUNITY): Payer: Self-pay

## 2022-07-05 ENCOUNTER — Ambulatory Visit (HOSPITAL_COMMUNITY)
Admission: EM | Admit: 2022-07-05 | Discharge: 2022-07-05 | Disposition: A | Discharge status: Home / Self Care | Payer: BC Managed Care – PPO | Attending: Emergency Medicine | Admitting: Emergency Medicine

## 2022-07-05 ENCOUNTER — Emergency Department (HOSPITAL_COMMUNITY): Payer: BC Managed Care – PPO | Admitting: Anesthesiology

## 2022-07-05 DIAGNOSIS — F17210 Nicotine dependence, cigarettes, uncomplicated: Secondary | ICD-10-CM | POA: Diagnosis not present

## 2022-07-05 DIAGNOSIS — S0266XB Fracture of symphysis of mandible, initial encounter for open fracture: Secondary | ICD-10-CM | POA: Insufficient documentation

## 2022-07-05 DIAGNOSIS — S02611A Fracture of condylar process of right mandible, initial encounter for closed fracture: Secondary | ICD-10-CM | POA: Insufficient documentation

## 2022-07-05 DIAGNOSIS — W19XXXA Unspecified fall, initial encounter: Secondary | ICD-10-CM | POA: Insufficient documentation

## 2022-07-05 DIAGNOSIS — S02609A Fracture of mandible, unspecified, initial encounter for closed fracture: Secondary | ICD-10-CM

## 2022-07-05 DIAGNOSIS — M264 Malocclusion, unspecified: Secondary | ICD-10-CM | POA: Insufficient documentation

## 2022-07-05 DIAGNOSIS — Z515 Encounter for palliative care: Secondary | ICD-10-CM

## 2022-07-05 DIAGNOSIS — R55 Syncope and collapse: Secondary | ICD-10-CM

## 2022-07-05 HISTORY — PX: ORIF MANDIBULAR FRACTURE: SHX2127

## 2022-07-05 LAB — CBC WITH DIFFERENTIAL/PLATELET
Abs Immature Granulocytes: 0.04 10*3/uL (ref 0.00–0.07)
Basophils Absolute: 0 10*3/uL (ref 0.0–0.1)
Basophils Relative: 0 %
Eosinophils Absolute: 0 10*3/uL (ref 0.0–0.5)
Eosinophils Relative: 0 %
HCT: 40.9 % (ref 36.0–46.0)
Hemoglobin: 13.5 g/dL (ref 12.0–15.0)
Immature Granulocytes: 0 %
Lymphocytes Relative: 14 %
Lymphs Abs: 1.3 10*3/uL (ref 0.7–4.0)
MCH: 31.9 pg (ref 26.0–34.0)
MCHC: 33 g/dL (ref 30.0–36.0)
MCV: 96.7 fL (ref 80.0–100.0)
Monocytes Absolute: 0.6 10*3/uL (ref 0.1–1.0)
Monocytes Relative: 7 %
Neutro Abs: 7.4 10*3/uL (ref 1.7–7.7)
Neutrophils Relative %: 79 %
Platelets: 245 10*3/uL (ref 150–400)
RBC: 4.23 MIL/uL (ref 3.87–5.11)
RDW: 11.9 % (ref 11.5–15.5)
WBC: 9.4 10*3/uL (ref 4.0–10.5)
nRBC: 0 % (ref 0.0–0.2)

## 2022-07-05 LAB — I-STAT BETA HCG BLOOD, ED (MC, WL, AP ONLY): I-stat hCG, quantitative: <5 m[IU]/mL (ref <5)

## 2022-07-05 LAB — BASIC METABOLIC PANEL
Anion gap: 9 (ref 5–15)
BUN: 10 mg/dL (ref 6–20)
CO2: 22 mmol/L (ref 22–32)
Calcium: 9.3 mg/dL (ref 8.9–10.3)
Chloride: 106 mmol/L (ref 98–111)
Creatinine, Ser: 0.58 mg/dL (ref 0.44–1.00)
GFR, Estimated: >60 mL/min (ref >60)
Glucose, Bld: 91 mg/dL (ref 70–99)
Potassium: 4.1 mmol/L (ref 3.5–5.1)
Sodium: 137 mmol/L (ref 135–145)

## 2022-07-05 SURGERY — OPEN REDUCTION INTERNAL FIXATION (ORIF) MANDIBULAR FRACTURE
Anesthesia: General | Site: Mouth

## 2022-07-05 MED ORDER — SUCCINYLCHOLINE CHLORIDE 200 MG/10ML IV SOSY
PREFILLED_SYRINGE | INTRAVENOUS | Status: DC | PRN
  Administered 2022-07-05: 60 mg via INTRAVENOUS

## 2022-07-05 MED ORDER — FENTANYL CITRATE (PF) 100 MCG/2ML IJ SOLN
INTRAMUSCULAR | Status: AC
  Filled 2022-07-05: qty 2

## 2022-07-05 MED ORDER — LIDOCAINE-EPINEPHRINE 1 %-1:100000 IJ SOLN
INTRAMUSCULAR | Status: AC
  Filled 2022-07-05: qty 1

## 2022-07-05 MED ORDER — SODIUM CHLORIDE 0.9 % IV SOLN: Freq: Once | INTRAVENOUS | Status: AC

## 2022-07-05 MED ORDER — DEXMEDETOMIDINE HCL IN NACL 80 MCG/20ML IV SOLN
INTRAVENOUS | Status: DC | PRN
  Administered 2022-07-05: 8 ug via BUCCAL

## 2022-07-05 MED ORDER — OXYCODONE HCL 5 MG/5ML PO SOLN: 5.0000 mg | ORAL | 0 refills | Status: AC | PRN

## 2022-07-05 MED ORDER — FENTANYL CITRATE (PF) 250 MCG/5ML IJ SOLN
INTRAMUSCULAR | Status: AC
  Filled 2022-07-05: qty 5

## 2022-07-05 MED ORDER — OXYCODONE HCL 5 MG PO TABS: 5.0000 mg | ORAL_TABLET | Freq: Once | ORAL | Status: AC | PRN

## 2022-07-05 MED ORDER — CLINDAMYCIN PHOSPHATE 900 MG/50ML IV SOLN
INTRAVENOUS | Status: DC | PRN
  Administered 2022-07-05: 900 mg via INTRAVENOUS

## 2022-07-05 MED ORDER — FENTANYL CITRATE (PF) 250 MCG/5ML IJ SOLN
INTRAMUSCULAR | Status: DC | PRN
  Administered 2022-07-05: 100 ug via INTRAVENOUS
  Administered 2022-07-05: 150 ug via INTRAVENOUS

## 2022-07-05 MED ORDER — CLINDAMYCIN PHOSPHATE 900 MG/50ML IV SOLN
INTRAVENOUS | Status: AC
  Filled 2022-07-05: qty 50

## 2022-07-05 MED ORDER — DEXAMETHASONE SODIUM PHOSPHATE 10 MG/ML IJ SOLN
INTRAMUSCULAR | Status: DC | PRN
  Administered 2022-07-05: 10 mg via INTRAVENOUS

## 2022-07-05 MED ORDER — PROPOFOL 10 MG/ML IV BOLUS
INTRAVENOUS | Status: AC
  Filled 2022-07-05: qty 20

## 2022-07-05 MED ORDER — PROPOFOL 10 MG/ML IV BOLUS
INTRAVENOUS | Status: DC | PRN
  Administered 2022-07-05: 200 mg via INTRAVENOUS

## 2022-07-05 MED ORDER — MORPHINE SULFATE (PF) 4 MG/ML IV SOLN
4.0000 mg | Freq: Once | INTRAVENOUS | Status: AC
  Administered 2022-07-05: 4 mg via INTRAVENOUS
  Filled 2022-07-05: qty 1

## 2022-07-05 MED ORDER — SUGAMMADEX SODIUM 200 MG/2ML IV SOLN
INTRAVENOUS | Status: DC | PRN
  Administered 2022-07-05: 200 mg via INTRAVENOUS

## 2022-07-05 MED ORDER — MIDAZOLAM HCL 2 MG/2ML IJ SOLN
INTRAMUSCULAR | Status: AC
  Filled 2022-07-05: qty 2

## 2022-07-05 MED ORDER — LACTATED RINGERS IV SOLN: INTRAVENOUS | Status: DC

## 2022-07-05 MED ORDER — OXYMETAZOLINE HCL 0.05 % NA SOLN
NASAL | Status: AC
  Filled 2022-07-05: qty 30

## 2022-07-05 MED ORDER — ONDANSETRON HCL 4 MG/2ML IJ SOLN
INTRAMUSCULAR | Status: AC
  Filled 2022-07-05: qty 2

## 2022-07-05 MED ORDER — FENTANYL CITRATE (PF) 100 MCG/2ML IJ SOLN
25.0000 ug | INTRAMUSCULAR | Status: DC | PRN
  Administered 2022-07-05 (×3): 50 ug via INTRAVENOUS

## 2022-07-05 MED ORDER — OXYCODONE HCL 5 MG/5ML PO SOLN
5.0000 mg | Freq: Once | ORAL | Status: AC | PRN
  Administered 2022-07-05: 5 mg via ORAL

## 2022-07-05 MED ORDER — AMISULPRIDE (ANTIEMETIC) 5 MG/2ML IV SOLN
INTRAVENOUS | Status: AC
  Filled 2022-07-05: qty 4

## 2022-07-05 MED ORDER — ACETAMINOPHEN 10 MG/ML IV SOLN: 1000.0000 mg | Freq: Once | INTRAVENOUS | Status: DC | PRN

## 2022-07-05 MED ORDER — KETOROLAC TROMETHAMINE 30 MG/ML IJ SOLN: 30.0000 mg | Freq: Once | INTRAMUSCULAR | Status: DC

## 2022-07-05 MED ORDER — ACETAMINOPHEN 10 MG/ML IV SOLN
INTRAVENOUS | Status: DC | PRN
  Administered 2022-07-05: 1000 mg via INTRAVENOUS

## 2022-07-05 MED ORDER — 0.9 % SODIUM CHLORIDE (POUR BTL) OPTIME
TOPICAL | Status: DC | PRN
  Administered 2022-07-05: 1000 mL

## 2022-07-05 MED ORDER — ONDANSETRON HCL 4 MG/2ML IJ SOLN
INTRAMUSCULAR | Status: DC | PRN
  Administered 2022-07-05: 4 mg via INTRAVENOUS

## 2022-07-05 MED ORDER — OXYCODONE HCL 5 MG/5ML PO SOLN
ORAL | Status: AC
  Filled 2022-07-05: qty 5

## 2022-07-05 MED ORDER — ACETAMINOPHEN 10 MG/ML IV SOLN
INTRAVENOUS | Status: AC
  Filled 2022-07-05: qty 100

## 2022-07-05 MED ORDER — LIDOCAINE-EPINEPHRINE 1 %-1:100000 IJ SOLN
INTRAMUSCULAR | Status: DC | PRN
  Administered 2022-07-05: 5 mL

## 2022-07-05 MED ORDER — ORAL CARE MOUTH RINSE: 15.0000 mL | Freq: Once | OROMUCOSAL | Status: DC

## 2022-07-05 MED ORDER — AMISULPRIDE (ANTIEMETIC) 5 MG/2ML IV SOLN
10.0000 mg | Freq: Once | INTRAVENOUS | Status: AC | PRN
  Administered 2022-07-05: 10 mg via INTRAVENOUS

## 2022-07-05 MED ORDER — OXYMETAZOLINE HCL 0.05 % NA SOLN
NASAL | Status: DC | PRN
  Administered 2022-07-05 (×2): 2 via NASAL

## 2022-07-05 MED ORDER — CHLORHEXIDINE GLUCONATE 0.12 % MT SOLN: 15.0000 mL | Freq: Once | OROMUCOSAL | Status: DC

## 2022-07-05 MED ORDER — MIDAZOLAM HCL 5 MG/5ML IJ SOLN
INTRAMUSCULAR | Status: DC | PRN
  Administered 2022-07-05: 2 mg via INTRAVENOUS

## 2022-07-05 MED ORDER — ROCURONIUM BROMIDE 50 MG/5ML IV SOSY
PREFILLED_SYRINGE | INTRAVENOUS | Status: DC | PRN
  Administered 2022-07-05: 50 mg via INTRAVENOUS

## 2022-07-05 MED ORDER — KETOROLAC TROMETHAMINE 30 MG/ML IJ SOLN
30.0000 mg | Freq: Once | INTRAMUSCULAR | Status: AC
  Administered 2022-07-05: 30 mg via INTRAVENOUS
  Filled 2022-07-05: qty 1

## 2022-07-05 SURGICAL SUPPLY — 71 items
BAG COUNTER SPONGE SURGICOUNT (BAG) ×1 IMPLANT
BAR FIX PREFORMED OMNIMAX (Miscellaneous) IMPLANT
BIT DRILL 1.5X50 7MMSTP W/NTCH (BIT) IMPLANT
BIT DRILL 1.6X50 (BIT) IMPLANT
BLADE MANDIBULAR SCREWDRIVER (BLADE) IMPLANT
BLADE RECIPRO TAPERED (BLADE) IMPLANT
BLADE SURG 10 STRL SS (BLADE) IMPLANT
BLADE SURG 15 STRL LF DISP TIS (BLADE) IMPLANT
BLADE SURG 15 STRL SS (BLADE)
BUR CROSS CUT FISSURE 1.6 (BURR) IMPLANT
BUR RND DIAMOND ELITE 3.5 (BURR) IMPLANT
BUR SURG 4X8 MED (BURR) IMPLANT
BURR SURG 4X8 MED (BURR)
CANISTER SUCT 3000ML PPV (MISCELLANEOUS) ×1 IMPLANT
CLEANER TIP ELECTROSURG 2X2 (MISCELLANEOUS) ×1 IMPLANT
COVER SURGICAL LIGHT HANDLE (MISCELLANEOUS) ×1 IMPLANT
DRAPE HALF SHEET 40X57 (DRAPES) IMPLANT
DRILL 1.5X50 7MM STP W/NOTCH (BIT) ×1
ELECT COATED BLADE 2.86 ST (ELECTRODE) ×1 IMPLANT
ELECT NDL BLADE 2-5/6 (NEEDLE) IMPLANT
ELECT NEEDLE BLADE 2-5/6 (NEEDLE) ×1 IMPLANT
ELECT REM PT RETURN 9FT ADLT (ELECTROSURGICAL) ×1
ELECTRODE REM PT RTRN 9FT ADLT (ELECTROSURGICAL) ×1 IMPLANT
GLOVE BIO SURGEON STRL SZ8 (GLOVE) ×1 IMPLANT
GLOVE BIOGEL PI IND STRL 8 (GLOVE) ×1 IMPLANT
GOWN STRL REUS W/ TWL LRG LVL3 (GOWN DISPOSABLE) ×1 IMPLANT
GOWN STRL REUS W/ TWL XL LVL3 (GOWN DISPOSABLE) ×1 IMPLANT
GOWN STRL REUS W/TWL LRG LVL3 (GOWN DISPOSABLE) ×1
GOWN STRL REUS W/TWL XL LVL3 (GOWN DISPOSABLE) ×1
KIT BASIN OR (CUSTOM PROCEDURE TRAY) ×1 IMPLANT
KIT TURNOVER KIT B (KITS) ×1 IMPLANT
NDL HYPO 25GX1X1/2 BEV (NEEDLE) ×1 IMPLANT
NEEDLE HYPO 25GX1X1/2 BEV (NEEDLE) ×1 IMPLANT
NS IRRIG 1000ML POUR BTL (IV SOLUTION) ×1 IMPLANT
PAD ARMBOARD 7.5X6 YLW CONV (MISCELLANEOUS) ×2 IMPLANT
PATTIES SURGICAL .5 X3 (DISPOSABLE) IMPLANT
PENCIL BUTTON HOLSTER BLD 10FT (ELECTRODE) ×1 IMPLANT
PLATE 4HOLE STR 1.6MM (Plate) IMPLANT
PLATE LOCK STRT 4H 1/SCREW (Plate) IMPLANT
POSITIONER HEAD DONUT 9IN (MISCELLANEOUS) ×1 IMPLANT
PROTECTOR CORNEAL (OPHTHALMIC RELATED) IMPLANT
SCISSORS WIRE ANG 4 3/4 DISP (INSTRUMENTS) IMPLANT
SCREW BONE 2X7 CROSS DRIVE (Screw) IMPLANT
SCREW BONE MANDIB SD 2X9 (Screw) IMPLANT
SCREW LOCKING X-DR 2.3X12 (Screw) IMPLANT
SCREW NON LOCK X-DR 2.0X12 (Screw) IMPLANT
SCREW NON LOCK X-DR 2.0X5 (Screw) IMPLANT
SCREW NON LOCK X-DR 2.3X12 (Screw) IMPLANT
SCREW NON LOCK X-DR 2.3X6 (Screw) IMPLANT
SCREW X-DR EMERG 2.7X12 (Screw) IMPLANT
SCREW X-DR EMERG 2.7X8 (Screw) IMPLANT
SPIKE FLUID TRANSFER (MISCELLANEOUS) ×1 IMPLANT
SUT CHROMIC 3 0 PS 2 (SUTURE) IMPLANT
SUT CHROMIC 4 0 PS 2 18 (SUTURE) IMPLANT
SUT ETHILON 5 0 P 3 18 (SUTURE)
SUT MNCRL AB 4-0 PS2 18 (SUTURE) IMPLANT
SUT NYLON ETHILON 5-0 P-3 1X18 (SUTURE) IMPLANT
SUT PROLENE 5 0 PS 2 (SUTURE) ×1 IMPLANT
SUT SILK 2 0 PERMA HAND 18 BK (SUTURE) IMPLANT
SUT STEEL 0 (SUTURE)
SUT STEEL 0 18XMFL TIE 17 (SUTURE) IMPLANT
SUT STEEL 4 (SUTURE) IMPLANT
SUT VIC AB 3-0 SH 27 (SUTURE) ×1
SUT VIC AB 3-0 SH 27X BRD (SUTURE) ×1 IMPLANT
SUT VIC AB 4-0 RB1 27 (SUTURE) ×2
SUT VIC AB 4-0 RB1 27X BRD (SUTURE) IMPLANT
SUT VICRYL 4-0 PS2 18IN ABS (SUTURE) ×1 IMPLANT
TOWEL GREEN STERILE FF (TOWEL DISPOSABLE) ×1 IMPLANT
TRAY ENT MC OR (CUSTOM PROCEDURE TRAY) ×1 IMPLANT
WATER STERILE IRR 1000ML POUR (IV SOLUTION) ×1 IMPLANT
WIRE 24 GAUGE OMINIMAX MMF (WIRE) IMPLANT

## 2022-07-05 NOTE — Anesthesia Postprocedure Evaluation (Signed)
Anesthesia Post Note  Patient: Erin Pugh  Procedure(s) Performed: OPEN REDUCTION INTERNAL FIXATION (ORIF) MANDIBULAR FRACTURE - SYMPHYSIS (Mouth)     Patient location during evaluation: PACU Anesthesia Type: General Level of consciousness: awake and alert Pain management: pain level controlled Vital Signs Assessment: post-procedure vital signs reviewed and stable Respiratory status: spontaneous breathing, nonlabored ventilation and respiratory function stable Cardiovascular status: blood pressure returned to baseline and stable Postop Assessment: no apparent nausea or vomiting Anesthetic complications: yes   Encounter Notable Events  Notable Event Outcome Phase Comment  Difficult to intubate - unexpected  Intraprocedure Filed from anesthesia note documentation.    Last Vitals:  Vitals:   07/05/22 2115 07/05/22 2130  BP: 111/77   Pulse: 88 75  Resp: 13   Temp: (!) 36.2 C   SpO2: 96% 96%    Last Pain:  Vitals:   07/05/22 2115  TempSrc:   PainSc: [place]

## 2022-07-05 NOTE — Consult Note (Signed)
Reason for Consult: Mandible Fracture  Erin Pugh is an 26 y.o. female.  HPI: This is a 26 year old female who presented to an outside dental office with complaint of malocclusion and bleeding from the mouth after a fall. The patient does not recall the fall or the events leading up to the fall, but woke up with blood on the pillow and went to the spare bedroom to find blood on the floor. It is assumed that she suffered a syncopal event and hit her chin while falling. She has gone through a neurology workup at [facility] for previous syncopal episodes, and no evidence of epileptic activity was noted. She was referred to my office and noted to have a significant bilateral mandible fracture, and was sent to the ER to facilitate treatment.   Past Medical History:  Diagnosis Date   Anxiety    Crohn's disease ([place])    Seizures ([place])     Past Surgical History:  Procedure Laterality Date   COLONOSCOPY WITH PROPOFOL N/A 10/19/2020   Procedure: COLONOSCOPY WITH PROPOFOL;  Surgeon: Toledo, Benay Pike, MD;  Location: [facility] ENDOSCOPY;  Service: Gastroenterology;  Laterality: N/A;   ESOPHAGOGASTRODUODENOSCOPY (EGD) WITH PROPOFOL N/A 10/19/2020   Procedure: ESOPHAGOGASTRODUODENOSCOPY (EGD) WITH PROPOFOL;  Surgeon: Toledo, Benay Pike, MD;  Location: [facility] ENDOSCOPY;  Service: Gastroenterology;  Laterality: N/A;   HIP SURGERY     WISDOM TOOTH EXTRACTION      No family history on file.  Social History:  reports that she has been smoking. She has been smoking an average of .5 packs per day. She has never used smokeless tobacco. She reports current alcohol use. She reports that she does not currently use drugs.  Allergies:  Allergies  Allergen Reactions   Hydrocodone Itching   Keppra [Levetiracetam]    Omnicef [Cefdinir]    Promethazine     Medications: I have reviewed the patient's current medications.  Results for orders placed or performed during the hospital encounter of 07/05/22 (from the past 48  hour(s))  CBC with Differential     Status: None   Collection Time: 07/05/22 11:02 AM  Result Value Ref Range   WBC 9.4 4.0 - 10.5 K/uL   RBC 4.23 3.87 - 5.11 MIL/uL   Hemoglobin 13.5 12.0 - 15.0 g/dL   HCT 40.9 36.0 - 46.0 %   MCV 96.7 80.0 - 100.0 fL   MCH 31.9 26.0 - 34.0 pg   MCHC 33.0 30.0 - 36.0 g/dL   RDW 11.9 11.5 - 15.5 %   Platelets 245 150 - 400 K/uL   nRBC 0.0 0.0 - 0.2 %   Neutrophils Relative % 79 %   Neutro Abs 7.4 1.7 - 7.7 K/uL   Lymphocytes Relative 14 %   Lymphs Abs 1.3 0.7 - 4.0 K/uL   Monocytes Relative 7 %   Monocytes Absolute 0.6 0.1 - 1.0 K/uL   Eosinophils Relative 0 %   Eosinophils Absolute 0.0 0.0 - 0.5 K/uL   Basophils Relative 0 %   Basophils Absolute 0.0 0.0 - 0.1 K/uL   Immature Granulocytes 0 %   Abs Immature Granulocytes 0.04 0.00 - 0.07 K/uL    Comment: Performed at [facility], [address] [place], [place] [address]  Basic metabolic panel     Status: None   Collection Time: 07/05/22 11:02 AM  Result Value Ref Range   Sodium 137 135 - 145 mmol/L   Potassium 4.1 3.5 - 5.1 mmol/L   Chloride 106 98 -  111 mmol/L   CO2 22 22 - 32 mmol/L   Glucose, Bld 91 70 - 99 mg/dL    Comment: Glucose reference range applies only to samples taken after fasting for at least 8 hours.   BUN 10 6 - 20 mg/dL   Creatinine, Ser 3.57 0.44 - 1.00 mg/dL   Calcium 9.3 8.9 - 01.7 mg/dL   GFR, Estimated >79 >39 mL/min    Comment: (NOTE) Calculated using the CKD-EPI Creatinine Equation (2021)    Anion gap 9 5 - 15    Comment: Performed at [facility], [address] [place], [place] [address]  I-Stat Beta hCG blood, ED ([facility], [facility], [facility] only)     Status: None   Collection Time: 07/05/22 11:38 AM  Result Value Ref Range   I-stat hCG, quantitative <5.0 <5 mIU/mL   Comment 3            Comment:   GEST. AGE      CONC.  (mIU/mL)   <=1 WEEK        5 - 50     2 WEEKS       50 - 500     3 WEEKS       100 - 10,000     4 WEEKS     1,000 - 30,000        FEMALE  AND NON-PREGNANT FEMALE:     LESS THAN 5 mIU/mL     No results found.  Review of Systems Blood pressure 117/84, pulse 83, temperature 97.7 F (36.5 C), resp. rate 16, height 5\' 4"  (1.626 m), weight 64.4 kg, SpO2 100 %. Physical Exam  Maxillofacial Exam: Mild midline lower 1/3 edema Stepping appreciated at the inferior border of the mandible at the left symphysis V3 intact bilaterally No other injuries noted  Intraoral exam: Significant stepping of the mandibular dentition noted with a laceration between teeth #23/24 No dental pathology noted, teeth without decay   Assessment/Plan: 26yo F with significant mandibular symphysis and right condylar fractures requiring operative treatment. Discussed treatment options with the patient and risks of surgery. Will proceed with open reduction internal fixation of the symphysis fracture and closed reduction of the condyle fracture. NPO.   07/05/2022, 12:46 PM

## 2022-07-05 NOTE — ED Provider Notes (Signed)
  Physical Exam  BP 117/66   Pulse 76   Temp 97.7 F (36.5 C)   Resp 17   Ht 5\' 4"  (1.626 m)   Wt 64.4 kg   LMP  (Within Days)   SpO2 100%   BMI 24.37 kg/m   Physical Exam  Procedures  Procedures  ED Course / MDM   Clinical Course as of 07/05/22 1839  Tue Jul 05, 2022  1120 Dr. Don Broach, oral surgeon planning to do surgery today. Please call him for any consult on this patient. Dr. Conley Simmonds requested CT max face, CBC and BMP  [JR]  1311 Acute mandibular fracture seen on CTH, no other acute traumatic injuries. I spoke with Dr. Conley Simmonds of [facility] who plans to take the patient to the OR today. EKG pending, if within normal range can follow up with cardiology outpatient for syncope. [VK]  4081 Dr. Conley Simmonds confirmed plan for OR at 5 PM tonight. [VK]  Adrian Assumed care from Dr Maylon Peppers. 26 yo F with hx of seizures vs syncope who had LOC earlier today. Went to dentist and was found to have mandibular fracture. Will get OP cardiology referral for syncope. Will go to OR with [facility] (Dr Conley Simmonds) at ~5pm.  [RP]  1839 Pt taken to OR. Plan is to likely be dc'd from the OR afterwards.  [RP]    Clinical Course User Index [JR] Harriet Pho, PA-C [RP] Fransico Meadow, MD [VK] Kemper Durie, DO   Medical Decision Making Risk Prescription drug management. Decision regarding hospitalization.     Fransico Meadow, MD 07/05/22 1840

## 2022-07-05 NOTE — OR Nursing (Signed)
Care of patient assumed at 1905. 

## 2022-07-05 NOTE — Anesthesia Preprocedure Evaluation (Addendum)
Anesthesia Evaluation  Patient identified by MRN, date of birth, ID band Patient awake    Reviewed: Allergy & Precautions, NPO status , Patient's Chart, lab work & pertinent test results  Airway Mallampati: IV  TM Distance: >3 FB Neck ROM: Full  Mouth opening: Limited Mouth Opening  Dental  (+) Loose, Missing   Pulmonary Current SmokerPatient did not abstain from smoking.   Pulmonary exam normal        Cardiovascular negative cardio ROS Normal cardiovascular exam     Neuro/Psych Seizures -, Poorly Controlled,   Anxiety        GI/Hepatic Neg liver ROS,,,Crohn's disease    Endo/Other  negative endocrine ROS    Renal/GU negative Renal ROS     Musculoskeletal negative musculoskeletal ROS (+)    Abdominal   Peds  Hematology negative hematology ROS (+)   Anesthesia Other Findings closed fracture of jaw  Reproductive/Obstetrics Hcg negative                             Anesthesia Physical Anesthesia Plan  ASA: 2 and emergent  Anesthesia Plan: General   Post-op Pain Management:    Induction: Intravenous  PONV Risk Score and Plan: 2 and Ondansetron, Dexamethasone, Midazolam and Treatment may vary due to age or medical condition  Airway Management Planned: Nasal ETT  Additional Equipment:   Intra-op Plan:   Post-operative Plan: Extubation in OR  Informed Consent: I have reviewed the patients History and Physical, chart, labs and discussed the procedure including the risks, benefits and alternatives for the proposed anesthesia with the patient or authorized representative who has indicated his/her understanding and acceptance.     Dental advisory given  Plan Discussed with: CRNA  Anesthesia Plan Comments:         Anesthesia Quick Evaluation

## 2022-07-05 NOTE — Anesthesia Procedure Notes (Addendum)
Procedure Name: Intubation Date/Time: 07/05/2022 3:47 PM  Performed by: Renato Shin, CRNAPre-anesthesia Checklist: Patient identified, Emergency Drugs available, Suction available and Patient being monitored Patient Re-evaluated:Patient Re-evaluated prior to induction Oxygen Delivery Method: Circle system utilized Preoxygenation: Pre-oxygenation with 100% oxygen Induction Type: IV induction and Rapid sequence Ventilation: Mask ventilation without difficulty Laryngoscope Size: Miller and 2 Grade View: Grade II Nasal Tubes: Nasal prep performed and Left Tube size: 6.0 mm Placement Confirmation: ETT inserted through vocal cords under direct vision, positive ETCO2 and breath sounds checked- equal and bilateral Tube secured with: Tape Dental Injury: Teeth and Oropharynx as per pre-operative assessment  Difficulty Due To: Difficulty was unanticipated Comments: Attempted pass 7.0 through right nare--unsuccessful. ETT pass through nare with relative ease. Questionable false passage, ETT unable to be seen clearly when pt DL'ed to advance tube. ETT removed with ease. ETT tip free of matter. 6.0 ETT passed with ease through left nare. Tip clean. Magill's used to advance ETT through glottic opening. Bleeding noted in pt mouth from loose front teeth/mandible fx (damaged when pt fell). OGT passed--stomach empty.

## 2022-07-05 NOTE — Transfer of Care (Signed)
Immediate Anesthesia Transfer of Care Note  Patient: Erin Pugh  Procedure(s) Performed: OPEN REDUCTION INTERNAL FIXATION (ORIF) MANDIBULAR FRACTURE - SYMPHYSIS (Mouth)  Patient Location: PACU  Anesthesia Type:General  Level of Consciousness: drowsy  Airway & Oxygen Therapy: Patient Spontanous Breathing and Patient connected to face mask oxygen  Post-op Assessment: Report given to RN and Post -op Vital signs reviewed and stable  Post vital signs: Reviewed and stable  Last Vitals:  Vitals Value Taken Time  BP 102/67 07/05/22 1953  Temp    Pulse 69 07/05/22 1955  Resp 12 07/05/22 1955  SpO2 99 % 07/05/22 1955  Vitals shown include unvalidated device data.  Last Pain:  Vitals:   07/05/22 1706  TempSrc: Oral  PainSc: 5       Patients Stated Pain Goal: 0 (91/63/84 6659)  Complications:  Encounter Notable Events  Notable Event Outcome Phase Comment  Difficult to intubate - unexpected  Intraprocedure Filed from anesthesia note documentation.

## 2022-07-05 NOTE — ED Triage Notes (Signed)
Dr. Zelphia Cairo is the oral surgeon who referred to ED and stating he will perform surgery when OR time set.   His office number [phone]

## 2022-07-05 NOTE — ED Provider Notes (Signed)
[facility] EMERGENCY DEPARTMENT Provider Note   CSN: [id number] Arrival date & time: 07/05/22  1028     History  Chief Complaint  Patient presents with   Loss of Consciousness    Erin Pugh is a 26 y.o. female.  Patient is a 26 year old female past medical history of Crohn's disease and seizure disorder presenting to the emergency department with jaw pain after a syncopal episode.  The patient states that she woke up this morning around 4 AM to get ready for work.  She states that next thing she knew she woke up in her gastrium surrounded by blood.  She states that she had significant jaw pain and looked in the mirror and noticed that her teeth looked misaligned.  The patient states that she has had similar syncopal type episodes in the past that were originally thought to be due to seizures.  She states that she has not had 1 of these episodes in about a year.  She states that normally she bites her tongue when she has a seizure but did not this time.  She denies any urinary incontinence.  She states this episode was not witnessed so unclear if it was a syncopal versus seizure episode.  She states that she had no preceding chest pain, shortness of breath or lightheadedness.  She denies any recent nausea, vomiting or diarrhea.  She went to see her dentist this morning who referred her to an oral surgeon who recommended that she come to the ER for evaluation prior to the OR today for surgical repair.  The history is provided by the patient and a parent.  Loss of Consciousness      Home Medications Prior to Admission medications   Medication Sig Start Date End Date Taking? Authorizing Provider  Ibuprofen 200 MG CAPS Take 400 mg by mouth daily as needed (pain).   Yes [provider]  MICROGESTIN 1-20 MG-MCG tablet Take 1 tablet by mouth daily. 12/23/19  Yes [provider]  valACYclovir (VALTREX) 1000 MG tablet Take 1,000 mg by mouth daily. 12/23/19   Yes [provider]      Allergies    Hydrocodone, Keppra [levetiracetam], Omnicef [cefdinir], and Promethazine    Review of Systems   Review of Systems  Cardiovascular:  Positive for syncope.    Physical Exam Updated Vital Signs BP 103/65   Pulse 60   Temp 97.7 F (36.5 C)   Resp 17   Ht 5\' 4"  (1.626 m)   Wt 64.4 kg   LMP  (Within Days)   SpO2 97%   BMI 24.37 kg/m  Physical Exam Vitals and nursing note reviewed.  Constitutional:      General: She is not in acute distress.    Appearance: Normal appearance.  HENT:     Head: Normocephalic and atraumatic.     Nose: Nose normal.     Mouth/Throat:     Mouth: Mucous membranes are moist.     Comments: Obvious laceration with malalignment of jaw between teeth #24 and #23. No active bleeding. No other facial tenderness to palpation.  Eyes:     Extraocular Movements: Extraocular movements intact.     Conjunctiva/sclera: Conjunctivae normal.     Pupils: Pupils are equal, round, and reactive to light.  Neck:     Comments: No midline neck tenderness Cardiovascular:     Rate and Rhythm: Normal rate and regular rhythm.     Pulses: Normal pulses.     Heart  sounds: Normal heart sounds.  Pulmonary:     Effort: Pulmonary effort is normal.     Breath sounds: Normal breath sounds.  Abdominal:     General: Abdomen is flat.     Palpations: Abdomen is soft.     Tenderness: There is no abdominal tenderness.  Musculoskeletal:        General: No swelling or tenderness. Normal range of motion.     Cervical back: Normal range of motion and neck supple.     Comments: No midline back tenderness  Skin:    General: Skin is warm and dry.     Findings: No bruising.  Neurological:     General: No focal deficit present.     Mental Status: She is alert and oriented to person, place, and time.     Sensory: No sensory deficit.     Motor: No weakness.  Psychiatric:        Mood and Affect: Mood normal.        Behavior: Behavior  normal.     ED Results / Procedures / Treatments   Labs (all labs ordered are listed, but only abnormal results are displayed) Labs Reviewed  CBC WITH DIFFERENTIAL/PLATELET  BASIC METABOLIC PANEL  I-STAT BETA HCG BLOOD, ED ([facility], [facility], [facility] ONLY)    EKG EKG Interpretation  Date/Time:  Tuesday July 05 2022 13:19:40 EDT Ventricular Rate:  70 PR Interval:  105 QRS Duration: 100 QT Interval:  436 QTC Calculation: 471 R Axis:   85 Text Interpretation: Sinus rhythm Short PR interval Borderline Q waves in lateral leads No significant change since last tracing Confirmed by Leanord Asal ([id number]) on 07/05/2022 1:22:12 PM  Radiology CT Maxillofacial WO CM  Result Date: 07/05/2022 CLINICAL DATA:  Facial fracture, follow up; Head trauma, moderate-severe. Syncope. Mandible fracture and loose teeth. EXAM: CT HEAD WITHOUT CONTRAST CT MAXILLOFACIAL WITHOUT CONTRAST TECHNIQUE: Multidetector CT imaging of the head and maxillofacial structures were performed using the standard protocol without intravenous contrast. Multiplanar CT image reconstructions of the maxillofacial structures were also generated. RADIATION DOSE REDUCTION: This exam was performed according to the departmental dose-optimization program which includes automated exposure control, adjustment of the mA and/or kV according to patient size and/or use of iterative reconstruction technique. COMPARISON:  Head CT 01/11/2020 FINDINGS: CT HEAD FINDINGS Brain: There is no evidence of an acute infarct, intracranial hemorrhage, mass, midline shift, or extra-axial fluid collection. The ventricles and sulci are normal. Vascular: No hyperdense vessel. Skull: No fracture or suspicious osseous lesion. Other: None. CT MAXILLOFACIAL FINDINGS Osseous: Acute, mildly comminuted, mildly displaced fracture of the mandible in the left parasymphyseal region with involvement of the sockets of the central and lateral incisors. Displaced fracture of the base of the  mandibular condyle on the right. Asymmetric widening of the right temporomandibular joint without dislocation. Orbits: Unremarkable. Sinuses: Mucous retention cyst in the left maxillary sinus. No sinus fluid. Clear mastoid air cells. Soft tissues: Soft tissue contusion in the left submental region. IMPRESSION: 1. Acute mandibular fractures as above. 2. No evidence of acute intracranial abnormality. Electronically Signed   By: Logan Bores M.D.   On: 07/05/2022 12:35   CT Head Wo Contrast  Result Date: 07/05/2022 CLINICAL DATA:  Facial fracture, follow up; Head trauma, moderate-severe. Syncope. Mandible fracture and loose teeth. EXAM: CT HEAD WITHOUT CONTRAST CT MAXILLOFACIAL WITHOUT CONTRAST TECHNIQUE: Multidetector CT imaging of the head and maxillofacial structures were performed using the standard protocol without intravenous contrast. Multiplanar CT image reconstructions of the maxillofacial  structures were also generated. RADIATION DOSE REDUCTION: This exam was performed according to the departmental dose-optimization program which includes automated exposure control, adjustment of the mA and/or kV according to patient size and/or use of iterative reconstruction technique. COMPARISON:  Head CT 01/11/2020 FINDINGS: CT HEAD FINDINGS Brain: There is no evidence of an acute infarct, intracranial hemorrhage, mass, midline shift, or extra-axial fluid collection. The ventricles and sulci are normal. Vascular: No hyperdense vessel. Skull: No fracture or suspicious osseous lesion. Other: None. CT MAXILLOFACIAL FINDINGS Osseous: Acute, mildly comminuted, mildly displaced fracture of the mandible in the left parasymphyseal region with involvement of the sockets of the central and lateral incisors. Displaced fracture of the base of the mandibular condyle on the right. Asymmetric widening of the right temporomandibular joint without dislocation. Orbits: Unremarkable. Sinuses: Mucous retention cyst in the left maxillary  sinus. No sinus fluid. Clear mastoid air cells. Soft tissues: Soft tissue contusion in the left submental region. IMPRESSION: 1. Acute mandibular fractures as above. 2. No evidence of acute intracranial abnormality. Electronically Signed   By: Logan Bores M.D.   On: 07/05/2022 12:35    Procedures Procedures    Medications Ordered in ED Medications  ketorolac (TORADOL) 30 MG/ML injection 30 mg (30 mg Intravenous Given 07/05/22 1140)  morphine (PF) 4 MG/ML injection 4 mg (4 mg Intravenous Given 07/05/22 1325)  0.9 %  sodium chloride infusion ( Intravenous New Bag/Given 07/05/22 1506)    ED Course/ Medical Decision Making/ A&P Clinical Course as of 07/05/22 1517  Tue Jul 05, 2022  1120 Dr. Don Broach, oral surgeon planning to do surgery today. Please call him for any consult on this patient. Dr. Conley Simmonds requested CT max face, CBC and BMP  [JR]  1311 Acute mandibular fracture seen on CTH, no other acute traumatic injuries. I spoke with Dr. Conley Simmonds of [facility] who plans to take the patient to the OR today. EKG pending, if within normal range can follow up with cardiology outpatient for syncope. [VK]  [id number] Dr. Conley Simmonds confirmed plan for OR at 5 PM tonight. [VK]  [place] Assumed care from Dr Maylon Peppers. 26 yo F with hx of seizures vs syncope who had LOC earlier today. Went to dentist and was found to have mandibular fracture. Will get OP cardiology referral for syncope. Will go to OR with [facility] (Dr Conley Simmonds) at ~5pm.  [RP]    Clinical Course User Index [JR] Harriet Pho, PA-C [RP] Fransico Meadow, MD [VK] Kemper Durie, DO                           Medical Decision Making This patient presents to the ED with chief complaint(s) of jaw pain and syncope with pertinent past medical history of seizures, Crohn's which further complicates the presenting complaint. The complaint involves an extensive differential diagnosis and also carries with it a high risk of complications and  morbidity.    The differential diagnosis includes arrhythmia, electrolyte abnormality, pregnancy, anemia, facial fracture, ICH, mass effect, no other signs of traumatic injury on exam  Additional history obtained: Additional history obtained from family Records reviewed prior neurology records  ED Course and Reassessment: Patient was initially evaluated by provider in triage and had labs and CT head and max face performed.  Labs are within normal range.  EKG will additionally be performed to evaluate for arrhythmia as cause of her syncopal episode.  [facility] has been contacted that the patient is here being evaluated  prior to surgical clearance.  Independent labs interpretation:  The following labs were independently interpreted: within normal range  Independent visualization of imaging: - I independently visualized the following imaging with scope of interpretation limited to determining acute life threatening conditions related to emergency care: CT/Max-Face, which revealed acute mandibular fracture  Consultation: - Consulted or discussed management/test interpretation w/ external professional: [facility]  Consideration for admission or further workup: plan to admit to OR with [facility] Social Determinants of health: N/A    Risk Prescription drug management. Decision regarding hospitalization.          Final Clinical Impression(s) / ED Diagnoses Final diagnoses:  Syncope, unspecified syncope type  Closed fracture of jaw, initial encounter ([place])    Rx / DC Orders ED Discharge Orders          Ordered    Ambulatory referral to Cardiology       Comments: If you have not heard from the Cardiology office within the next 72 hours please call [phone].   07/05/22 [place], [place], DO 07/05/22 1517

## 2022-07-05 NOTE — ED Triage Notes (Signed)
Pt has hx of syncopal episodes, been seen by neurology for same. Had syncopal episode at 0400 this morning. Went to dentist for loose teeth on bottom in the front. Pt's teeth are misaligned in this area. Pt referred to oral surgeon and then ED for urgent surgery due to jaw broken in bottom front and in the L mastoid area per pt's father.   Per pt's history she has seizures, but this was reportedly ruled out. Pt not on antiepileptic.

## 2022-07-05 NOTE — Discharge Instructions (Signed)
Maintain a liquid diet Ice to face x 48 hours Keep wire cutters with you at all times, in times of respiratory distress cut vertical wires and contact Dr. Conley Simmonds

## 2022-07-05 NOTE — Op Note (Signed)
OPERATIVE REPORT  Patient: Erin Pugh DOB: Jul 30, 1996 MRN: [id number] Date: 07/05/22 Surgeon: Dr. Zelphia Cairo Assistant: Carolee Rota  Pre-operative Diagnosis: 1) Open fracture of the left mandibular parasymphysis 2) Closed fracture of the right condylar neck  Post-operative Diagnosis: 1) Open fracture of the left mandibular parasymphysis 2) Closed fracture of the right condylar neck  Procedure: 1) Open reduction internal fixation left mandibular parasymphysis fracture 2) Closed reduction maxillomandibular fixation right condylar neck fracture  Complications: none EBL: 50cc  Indication for procedure: This is a 26 year old female who presented to an outside dental office with complaint of malocclusion and bleeding from the mouth after a fall, which she does not recall. She has suffered several episodes like this before. She has gone through a neurology workup at [facility] for previous syncopal episodes, and no evidence of epileptic activity was noted. She was referred to my office and noted to have a significant bilateral mandible fracture, and was sent to the ER to facilitate treatment.  CT revealed significantly displaced fractures requiring operative treatment.   Description of procedure: The patient was brought to the operating room and placed supine on the operating room table. After anesthesia was successfully induced via a nasotracheal tube, the patient was prepped and draped for this type of surgical procedure. A total of 5cc 1% Lidocaine with 1:100,000 epinephrine was injected locally in the mandibular symphysis.  Attention was then turned to the symphysis fracture and an incision was made with Bovie electrocautery in the mandibular vestibule leaving at least a 73mm cuff of tissue for closure. Dissection continued through the mentalis muscle until bone was encountered. The periosteum was sharply incised and periosteal elevator used to expose the full extent of the fracture and  create room for plating the fracture. During dissection, the left mental nerve was encountered and skeletonized, and protected throughout the remainder of the procedure. The fracture was noted to be grossly mobile and able to be reduced manually.   At this time, we placed hybrid arch bars across the maxilla utilizing 80mm screws x4. We then placed cut a second set of hybrid arch bars into two segments and secured them on either side of the fracture to allow for reduction. The patient was then placed into intermaxillary fixation using 25 gauge wire. The fracture was then reduced and occlusion was checked. Bone reduction forceps utilized to gain excellent reduction and fixate the segments temporarily during plating. A Biomet 1.47mm thick fracture plate was sequentially bent and adapted to the inferior border of the mandible spanning the fracture. This plate was secured with bicortical screws x6. A second 1.16mm fracture plate was then bent and adapted to the superior aspect of the fracture, above the mental nerve. This was secured with 4 monocortical screws. The site was irrigated with NS. The mentalis was re-suspended with 4-0 Vicryl sutures and the mucosa was closed with 4-0 Vicryl in a running fashion.   The patient was then turned back over to the anesthesia team who successfully extubated the patient and transported her to PACU for recovery. The patient remained wired shut, so she was accompanied by myself, and wirecutters were affixed to the hospital bed. Implicit directions were reviewed with the nursing staff in regards to cutting the wires in times of respiratory distress. She will be discharged with these wirecutters and the same instructions were reviewed with family. Plan for discharge to home tonight.

## 2022-07-05 NOTE — ED Provider Triage Note (Addendum)
Emergency Medicine Provider Triage Evaluation Note  Erin Pugh , a 26 y.o. female  was evaluated in triage.  Pt complains of loss of consciousness and facial trauma.  Patient has history of seizures and syncopal disorder.  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  Patient states that she passed out this morning and hit her head.  Went to dentist after incident due to loose tooth associated with her fall.  Evaluated dentist and was found to have broken jaw in the left mastoid area per patient's father.  Was advised to come to the emergency department by Dr. Don Broach, her oral surgeon who is planning to surgically repair her jaw this admission.  Review of Systems  Positive: See above Negative: See above  Physical Exam  BP 117/84   Pulse 83   Temp 97.7 F (36.5 C)   Resp 16   Ht 5\' 4"  (1.626 m)   Wt 64.4 kg   LMP  (Within Days)   SpO2 100%   BMI 24.37 kg/m  Gen:   Awake, no distress   Resp:  Normal effort  MSK:   Moves extremities without difficulty  Other:    Medical Decision Making  Medically screening exam initiated at 11:28 AM.  Appropriate orders placed.  Brekyn Huntoon was informed that the remainder of the evaluation will be completed by another provider, this initial triage assessment does not replace that evaluation, and the importance of remaining in the ED until their evaluation is complete.  CT head, max face, basic labs, toradol   Harriet Pho, PA-C 07/05/22 1132    Harriet Pho, PA-C 07/05/22 1134

## 2022-07-07 ENCOUNTER — Encounter (HOSPITAL_COMMUNITY): Payer: Self-pay | Admitting: Oral Surgery

## 2022-07-31 NOTE — Progress Notes (Unsigned)
No chief complaint on file.     History of Present Illness: 26 yo female with history of anxiety, Crohn's disease and seizures who is here today as a new consult for the evaluation of syncope. She was seen in the ED at [facility] on 07/05/22 after a syncopal event sustaining a mandibular fracture.   Primary Care Physician: System, Provider Not In   Past Medical History:  Diagnosis Date   Anxiety    Crohn's disease (HCC)    Seizures (HCC)    Syncope     Past Surgical History:  Procedure Laterality Date   COLONOSCOPY WITH PROPOFOL N/A 10/19/2020   Procedure: COLONOSCOPY WITH PROPOFOL;  Surgeon: Toledo, Boykin Nearing, MD;  Location: [facility] ENDOSCOPY;  Service: Gastroenterology;  Laterality: N/A;   ESOPHAGOGASTRODUODENOSCOPY (EGD) WITH PROPOFOL N/A 10/19/2020   Procedure: ESOPHAGOGASTRODUODENOSCOPY (EGD) WITH PROPOFOL;  Surgeon: Toledo, Boykin Nearing, MD;  Location: [facility] ENDOSCOPY;  Service: Gastroenterology;  Laterality: N/A;   HIP SURGERY     ORIF MANDIBULAR FRACTURE N/A 07/05/2022   Procedure: OPEN REDUCTION INTERNAL FIXATION (ORIF) MANDIBULAR FRACTURE - SYMPHYSIS;  Surgeon: Erin Pugh, DMD;  Location: [facility] OR;  Service: Oral Surgery;  Laterality: N/A;   WISDOM TOOTH EXTRACTION      Current Outpatient Medications  Medication Sig Dispense Refill   MICROGESTIN 1-20 MG-MCG tablet Take 1 tablet by mouth daily.     valACYclovir (VALTREX) 1000 MG tablet Take 1,000 mg by mouth daily.     No current facility-administered medications for this visit.    Allergies  Allergen Reactions   Hydrocodone Itching   Keppra [Levetiracetam] Other (See Comments)    suicidal   Omnicef [Cefdinir] Other (See Comments)    Redness   Promethazine Other (See Comments)    seizure    Social History   Socioeconomic History   Marital status: Single    Spouse name: Not on file   Number of children: Not on file   Years of education: Not on file   Highest education level: Not on file  Occupational History    Not on file  Tobacco Use   Smoking status: Every Day    Packs/day: 0.50    Types: Cigarettes   Smokeless tobacco: Never  Vaping Use   Vaping Use: Never used  Substance and Sexual Activity   Alcohol use: Yes   Drug use: Not Currently   Sexual activity: Not on file  Other Topics Concern   Not on file  Social History Narrative   Not on file   Social Determinants of Health   Financial Resource Strain: Not on file  Food Insecurity: Not on file  Transportation Needs: Not on file  Physical Activity: Not on file  Stress: Not on file  Social Connections: Not on file  Intimate Partner Violence: Not on file    No family history on file.  Review of Systems:  As stated in the HPI and otherwise negative.   LMP 07/02/2022 (Within Days)   Physical Examination: General: Well developed, well nourished, NAD  HEENT: OP clear, mucus membranes moist  SKIN: warm, dry. No rashes. Neuro: No focal deficits  Musculoskeletal: Muscle strength 5/5 all ext  Psychiatric: Mood and affect normal  Neck: No JVD, no carotid bruits, no thyromegaly, no lymphadenopathy.  Lungs:Clear bilaterally, no wheezes, rhonci, crackles Cardiovascular: Regular rate and rhythm. No murmurs, gallops or rubs. Abdomen:Soft. Bowel sounds present. Non-tender.  Extremities: No lower extremity edema. Pulses are 2 + in the bilateral DP/PT.  EKG:  EKG {ACTION; IS/IS BIP:[id number]} ordered today. The ekg ordered today demonstrates ***  Recent Labs: 07/05/2022: BUN 10; Creatinine, Ser 0.58; Hemoglobin 13.5; Platelets 245; Potassium 4.1; Sodium 137   Lipid Panel No results found for: "CHOL", "TRIG", "HDL", "CHOLHDL", "VLDL", "LDLCALC", "LDLDIRECT"   Wt Readings from Last 3 Encounters:  07/05/22 142 lb (64.4 kg)  10/19/20 135 lb (61.2 kg)  01/11/20 134 lb (60.8 kg)      Assessment and Plan:   1.   Labs/ tests ordered today include:  No orders of the defined types were placed in this encounter.    Disposition:    F/U with me in ***    Signed, Verne Carrow, MD, [facility] 07/31/2022 7:42 PM    [facility] [address] [place], [place], [place]  [address] Phone: [phone]; Fax: [phone]

## 2022-08-01 ENCOUNTER — Ambulatory Visit: Payer: BC Managed Care – PPO | Attending: Cardiovascular Disease

## 2022-08-01 ENCOUNTER — Encounter: Payer: Self-pay | Admitting: Cardiovascular Disease

## 2022-08-01 ENCOUNTER — Ambulatory Visit: Payer: BC Managed Care – PPO | Attending: Cardiovascular Disease | Admitting: Cardiovascular Disease

## 2022-08-01 VITALS — BP 100/80 | HR 91 | Ht 64.0 in | Wt 143.6 lb

## 2022-08-01 DIAGNOSIS — R55 Syncope and collapse: Secondary | ICD-10-CM | POA: Diagnosis not present

## 2022-08-01 NOTE — Patient Instructions (Signed)
Medication Instructions:  Your physician recommends that you continue on your current medications as directed. Please refer to the Current Medication list given to you today.  *If you need a refill on your cardiac medications before your next appointment, please call your pharmacy*   Lab Work: none  Testing/Procedures: Your physician has requested that you have an echocardiogram. Echocardiography is a painless test that uses sound waves to create images of your heart. It provides your doctor with information about the size and shape of your heart and how well your heart's chambers and valves are working. This procedure takes approximately one hour. There are no restrictions for this procedure. Please do NOT wear cologne, perfume, aftershave, or lotions (deodorant is allowed). Please arrive 15 minutes prior to your appointment time.  Zio Heart Monitor - 14 days - see instructions below   Follow-Up: In 3 months with Dr. Clifton James  Other Instructions ZIO XT- Long Term Monitor Instructions  Your physician has requested you wear a ZIO patch monitor for 14 days.  This is a single patch monitor. [employer] supplies one patch monitor per enrollment. Additional stickers are not available. Please do not apply patch if you will be having a Nuclear Stress Test,  Echocardiogram, Cardiac CT, MRI, or Chest Xray during the period you would be wearing the  monitor. The patch cannot be worn during these tests. You cannot remove and re-apply the  ZIO XT patch monitor.  Your ZIO patch monitor will be mailed 3 day USPS to your address on file. It may take 3-5 days  to receive your monitor after you have been enrolled.  Once you have received your monitor, please review the enclosed instructions. Your monitor  has already been registered assigning a specific monitor serial # to you.  Billing and Patient Assistance Program Information  We have supplied [employer] with any of your insurance information on file  for billing purposes. [employer] offers a sliding scale Patient Assistance Program for patients that do not have  insurance, or whose insurance does not completely cover the cost of the ZIO monitor.  You must apply for the Patient Assistance Program to qualify for this discounted rate.  To apply, please call [employer] at [phone], select option 4, select option 2, ask to apply for  Patient Assistance Program. Erin Pugh will ask your household income, and how many people  are in your household. They will quote your out-of-pocket cost based on that information.  [employer] will also be able to set up a 11-month, interest-free payment plan if needed.  Applying the monitor   Shave hair from upper left chest.  Hold abrader disc by orange tab. Rub abrader in 40 strokes over the upper left chest as  indicated in your monitor instructions.  Clean area with 4 enclosed alcohol pads. Let dry.  Apply patch as indicated in monitor instructions. Patch will be placed under collarbone on left  side of chest with arrow pointing upward.  Rub patch adhesive wings for 2 minutes. Remove white label marked "1". Remove the white  label marked "2". Rub patch adhesive wings for 2 additional minutes.  While looking in a mirror, press and release button in center of patch. A small green light will  flash 3-4 times. This will be your only indicator that the monitor has been turned on.  Do not shower for the first 24 hours. You may shower after the first 24 hours.  Press the button if you feel a symptom. You will hear a small  click. Record Date, Time and  Symptom in the Patient Logbook.  When you are ready to remove the patch, follow instructions on the last 2 pages of Patient  Logbook. Stick patch monitor onto the last page of Patient Logbook.  Place Patient Logbook in the blue and white box. Use locking tab on box and tape box closed  securely. The blue and white box has prepaid postage on it. Please place it in the  mailbox as  soon as possible. Your physician should have your test results approximately 7 days after the  monitor has been mailed back to [facility].  Call [facility] Customer Care at [phone] if you have questions regarding  your ZIO XT patch monitor. Call them immediately if you see an orange light blinking on your  monitor.  If your monitor falls off in less than 4 days, contact our Monitor department at [phone].  If your monitor becomes loose or falls off after 4 days call [employer] at [phone] for  suggestions on securing your monitor

## 2022-08-01 NOTE — Progress Notes (Unsigned)
Enrolled for [employer] to mail a ZIO XT long term holter monitor to the patients address on file.  

## 2022-08-08 DIAGNOSIS — R55 Syncope and collapse: Secondary | ICD-10-CM | POA: Diagnosis not present

## 2022-08-23 ENCOUNTER — Ambulatory Visit (HOSPITAL_COMMUNITY): Payer: BC Managed Care – PPO | Attending: Cardiovascular Disease

## 2022-08-23 DIAGNOSIS — R55 Syncope and collapse: Secondary | ICD-10-CM | POA: Diagnosis present

## 2022-08-23 LAB — ECHOCARDIOGRAM COMPLETE
Area-P 1/2: 3.89 cm2
S' Lateral: 3.2 cm

## 2022-08-27 ENCOUNTER — Encounter: Payer: Self-pay | Admitting: Cardiovascular Disease

## 2022-11-03 ENCOUNTER — Ambulatory Visit: Payer: BC Managed Care – PPO | Admitting: Cardiovascular Disease

## 2023-02-05 ENCOUNTER — Other Ambulatory Visit: Payer: Self-pay

## 2023-02-05 ENCOUNTER — Emergency Department
Admission: EM | Admit: 2023-02-05 | Discharge: 2023-02-05 | Disposition: A | Discharge status: Home / Self Care | Payer: BC Managed Care – PPO | Attending: Emergency Medicine | Admitting: Emergency Medicine

## 2023-02-05 ENCOUNTER — Emergency Department: Payer: BC Managed Care – PPO

## 2023-02-05 ENCOUNTER — Encounter: Payer: Self-pay | Admitting: Emergency Medicine

## 2023-02-05 DIAGNOSIS — G40909 Epilepsy, unspecified, not intractable, without status epilepticus: Secondary | ICD-10-CM | POA: Diagnosis not present

## 2023-02-05 DIAGNOSIS — R569 Unspecified convulsions: Secondary | ICD-10-CM

## 2023-02-05 LAB — CBC
HCT: 38.2 % (ref 36.0–46.0)
Hemoglobin: 12.6 g/dL (ref 12.0–15.0)
MCH: 32.4 pg (ref 26.0–34.0)
MCHC: 33 g/dL (ref 30.0–36.0)
MCV: 98.2 fL (ref 80.0–100.0)
Platelets: 272 10*3/uL (ref 150–400)
RBC: 3.89 MIL/uL (ref 3.87–5.11)
RDW: 11.9 % (ref 11.5–15.5)
WBC: 7.3 10*3/uL (ref 4.0–10.5)
nRBC: 0 % (ref 0.0–0.2)

## 2023-02-05 LAB — BASIC METABOLIC PANEL
Anion gap: 9 (ref 5–15)
BUN: 10 mg/dL (ref 6–20)
CO2: 21 mmol/L — ABNORMAL LOW (ref 22–32)
Calcium: 8.5 mg/dL — ABNORMAL LOW (ref 8.9–10.3)
Chloride: 105 mmol/L (ref 98–111)
Creatinine, Ser: 0.82 mg/dL (ref 0.44–1.00)
GFR, Estimated: >60 mL/min (ref >60)
Glucose, Bld: 86 mg/dL (ref 70–99)
Potassium: 3.6 mmol/L (ref 3.5–5.1)
Sodium: 135 mmol/L (ref 135–145)

## 2023-02-05 MED ORDER — SODIUM CHLORIDE 0.9 % IV BOLUS
1000.0000 mL | Freq: Once | INTRAVENOUS | Status: AC
  Administered 2023-02-05: 1000 mL via INTRAVENOUS

## 2023-02-05 MED ORDER — METOCLOPRAMIDE HCL 5 MG/ML IJ SOLN
10.0000 mg | Freq: Once | INTRAMUSCULAR | Status: AC
  Administered 2023-02-05: 10 mg via INTRAVENOUS
  Filled 2023-02-05: qty 2

## 2023-02-05 NOTE — ED Triage Notes (Signed)
Pt to ED via [facility] from for a seizure. Pt states that she has hx/o same. Pt states that her last seizure before today was 2 months ago. Pt states that she take Lamictal for her seizures, pt denies missing any medication. Pt denies increased alcohol or caffeine. Pt has deformity to her nose. Dried blood on the side of her head. Pt states that she is unsure what she hit her head on.

## 2023-02-05 NOTE — ED Triage Notes (Signed)
Pt in via EMS from home. Pt was found by her boyfriend having a seizure. Pt with hx of the same. Pt with blood to right side of face and swelling to bridge of her nose. Pt is compliant with seizure meds. 127/84, CBG 119, 100% RA. #18 to left AC, pt was given 4mg  of zofran en route.

## 2023-02-05 NOTE — ED Provider Notes (Signed)
[facility] Provider Note    Event Date/Time   First MD Initiated Contact with Patient 02/05/23 1600     (approximate)  History   Chief Complaint: Seizures  HPI  Erin Pugh is a 27 y.o. female with a past medical history of seizure disorder on Lamictal, anxiety, presents emergency department after a likely seizure.  According to the patient she was at home making food when she had a seizure.  Patient did hit her head.  Does have an abrasion over the bridge of her nose.  Patient states history of seizures she follows up with Dr. Malvin Johns actually has an appointment with Dr. Malvin Johns tomorrow.  States over the past year she has now had 4 seizures in the year before she did not have any.  Here the patient states generalized fatigue but no other acute complaints besides some pain over the bridge of the nose.  Patient appears to be back at her baseline largely.  Conversing answering questions appropriately following commands well.  Asking for something to eat.  Physical Exam   Triage Vital Signs: ED Triage Vitals  Enc Vitals Group     BP 02/05/23 1544 116/86     Pulse Rate 02/05/23 1544 94     Resp 02/05/23 1544 16     Temp 02/05/23 1544 98 F (36.7 C)     Temp src --      SpO2 02/05/23 1544 100 %     Weight 02/05/23 1542 140 lb (63.5 kg)     Height 02/05/23 1542 5\' 4"  (1.626 m)     Head Circumference --      Peak Flow --      Pain Score 02/05/23 1541 6     Pain Loc --      Pain Edu? --      Excl. in GC? --     Most recent vital signs: Vitals:   02/05/23 1544  BP: 116/86  Pulse: 94  Resp: 16  Temp: 98 F (36.7 C)  SpO2: 100%    General: Awake, no distress.  Small abrasion to the bridge of the nose.  Small amount of dried blood in the nostrils but no septal hematoma. CV:  Good peripheral perfusion.  Regular rate and rhythm  Resp:  Normal effort.  Equal breath sounds bilaterally.  Abd:  No distention.  Soft, nontender.  No rebound or  guarding.  ED Results / Procedures / Treatments   RADIOLOGY  I have reviewed and interpreted CT head images.  No obvious bleed seen on my evaluation. CT scan of the head is negative for acute abnormality per radiology.  They do notice a minimally displaced nasal bone fracture.   MEDICATIONS ORDERED IN ED: Medications  sodium chloride 0.9 % bolus 1,000 mL (has no administration in time range)  metoCLOPramide (REGLAN) injection 10 mg (has no administration in time range)     IMPRESSION / MDM / ASSESSMENT AND PLAN / ED COURSE  I reviewed the triage vital signs and the nursing notes.  Patient's presentation is most consistent with acute presentation with potential threat to life or bodily function.  Patient presents emergency department after seizure.  History of seizures on Lamictal actually has a follow-up appointment with her neurologist tomorrow.  We will check labs obtain CT scans of the head and face given the pain and swelling over the bridge of the nose.  We will IV hydrate.  Patient is asking for something to eat we will  feed the patient and continue to closely monitor while awaiting lab and CT results.  Patient agreeable to plan of care.  CT scan of the head is negative, CT face does show minimally displaced nasal bone fracture.  CBC is normal.  Chemistry is normal.  Patient continues to appear well.  No distress.  Patient is ready to go home.  She has follow-up with her neurologist tomorrow we will discharge home.  Discussed my typical seizure return precautions.  We will hold off on any medication changes as the patient sees her neurologist in the morning.  FINAL CLINICAL IMPRESSION(S) / ED DIAGNOSES   Seizure, epileptic   Note:  This document was prepared using Dragon voice recognition software and may include unintentional dictation errors.   Minna Antis, MD 02/05/23 [phone]

## 2023-10-10 ENCOUNTER — Other Ambulatory Visit (HOSPITAL_COMMUNITY)
Admission: RE | Admit: 2023-10-10 | Discharge: 2023-10-10 | Disposition: A | Discharge status: Home / Self Care | Payer: BC Managed Care – PPO | Source: Ambulatory Visit | Attending: Licensed Practical Nurse | Admitting: Licensed Practical Nurse

## 2023-10-10 ENCOUNTER — Ambulatory Visit: Payer: BC Managed Care – PPO | Admitting: Licensed Practical Nurse

## 2023-10-10 ENCOUNTER — Encounter: Payer: Self-pay | Admitting: Licensed Practical Nurse

## 2023-10-10 VITALS — BP 113/87 | HR 76 | Ht 64.0 in | Wt 144.3 lb

## 2023-10-10 DIAGNOSIS — Z113 Encounter for screening for infections with a predominantly sexual mode of transmission: Secondary | ICD-10-CM | POA: Insufficient documentation

## 2023-10-10 DIAGNOSIS — Z131 Encounter for screening for diabetes mellitus: Secondary | ICD-10-CM

## 2023-10-10 DIAGNOSIS — Z124 Encounter for screening for malignant neoplasm of cervix: Secondary | ICD-10-CM

## 2023-10-10 DIAGNOSIS — Z13220 Encounter for screening for lipoid disorders: Secondary | ICD-10-CM

## 2023-10-10 DIAGNOSIS — Z01419 Encounter for gynecological examination (general) (routine) without abnormal findings: Secondary | ICD-10-CM | POA: Insufficient documentation

## 2023-10-10 DIAGNOSIS — Z110 Encounter for screening for intestinal infectious diseases: Secondary | ICD-10-CM

## 2023-10-10 NOTE — Progress Notes (Unsigned)
Gynecology Annual Exam  PCP: System, Provider Not In  Chief Complaint:  Chief Complaint  Patient presents with  . Gynecologic Exam    History of Present Illness: Patient is a 28 y.o. No obstetric history on file. presents for annual exam. The patient has no complaints today.   LMP: Patient's last menstrual period was 09/21/2023 (exact date).  Average Interval: regular, 28 days Heavy Menses: no Clots: no Intermenstrual Bleeding: has brown discharge mid cycle  Postcoital Bleeding: no Dysmenorrhea: no  The patient is sexually active (not since September). She currently uses OCP (estrogen/progesterone) for contraception. She denies dyspareunia.  The patient does perform self breast exams.  There is no notable family history of breast or ovarian cancer in her family.  The patient wears seatbelts: yes.  The patient has regular exercise: yes.  Goes to the gym  The patient denies current symptoms of depression.  Has anxiety, managed with Effexor   Works at [employer] as a Administrator, Civil Service alone   Review of Systems: ROSsee HPI   Past Medical History:  Patient Active Problem List   Diagnosis Date Noted Date Diagnosed  . Hospice care patient 07/05/2022     Past Surgical History:  Past Surgical History:  Procedure Laterality Date  . COLONOSCOPY WITH PROPOFOL N/A 10/19/2020   Procedure: COLONOSCOPY WITH PROPOFOL;  Surgeon: Toledo, Boykin Nearing, MD;  Location: [facility] ENDOSCOPY;  Service: Gastroenterology;  Laterality: N/A;  . ESOPHAGOGASTRODUODENOSCOPY (EGD) WITH PROPOFOL N/A 10/19/2020   Procedure: ESOPHAGOGASTRODUODENOSCOPY (EGD) WITH PROPOFOL;  Surgeon: Toledo, Boykin Nearing, MD;  Location: [facility] ENDOSCOPY;  Service: Gastroenterology;  Laterality: N/A;  . HIP SURGERY    . ORIF MANDIBULAR FRACTURE N/A 07/05/2022   Procedure: OPEN REDUCTION INTERNAL FIXATION (ORIF) MANDIBULAR FRACTURE - SYMPHYSIS;  Surgeon: Exie Parody, DMD;  Location: [facility] OR;  Service: Oral Surgery;   Laterality: N/A;  . WISDOM TOOTH EXTRACTION      Gynecologic History:  Patient's last menstrual period was 09/21/2023 (exact date). Contraception: {method:5051} Last Pap: Results were: *** {Findings; lab pap smear results:16707::"NIL and HR HPV+","NIL and HR HPV negative"}   Obstetric History: No obstetric history on file.  Family History:  Family History  Problem Relation Age of Onset  . Heart failure Father   . Hyperlipidemia Father   . Hypertension Father   . Cancer Maternal Grandmother   . Hypertension Paternal Grandmother   . Hyperlipidemia Paternal Grandmother   . Heart failure Paternal Grandmother   . CAD Paternal Grandmother   . Hypertension Paternal Grandfather   . Heart failure Paternal Grandfather   . Hyperlipidemia Paternal Grandfather     Social History:  Social History   Socioeconomic History  . Marital status: Single    Spouse name: Not on file  . Number of children: Not on file  . Years of education: Not on file  . Highest education level: Not on file  Occupational History  . Not on file  Tobacco Use  . Smoking status: Former    Current packs/day: 0.50    Types: Cigarettes, E-cigarettes  . Smokeless tobacco: Never  Vaping Use  . Vaping status: Never Used  Substance and Sexual Activity  . Alcohol use: Not Currently  . Drug use: Not Currently  . Sexual activity: Not Currently    Birth control/protection: Pill  Other Topics Concern  . Not on file  Social History Narrative  . Not on file   Social Drivers of Health   Financial Resource Strain: Not on  file  Food Insecurity: Not on file  Transportation Needs: Not on file  Physical Activity: Not on file  Stress: Not on file  Social Connections: Not on file  Intimate Partner Violence: Not on file    Allergies:  Allergies  Allergen Reactions  . Hydrocodone Itching  . Keppra [Levetiracetam] Other (See Comments)    suicidal  . Omnicef [Cefdinir] Other (See Comments)    Redness  .  Promethazine Other (See Comments)    seizure    Medications: Prior to Admission medications   Medication Sig Start Date End Date Taking? Authorizing Provider  MICROGESTIN 1-20 MG-MCG tablet Take 1 tablet by mouth daily. 12/23/19  Yes [provider]  valACYclovir (VALTREX) 1000 MG tablet Take 1,000 mg by mouth daily. 12/23/19  Yes [provider]    Physical Exam Vitals: Blood pressure 113/87, pulse 76, height 5\' 4"  (1.626 m), weight 144 lb 4.8 oz (65.5 kg), last menstrual period 09/21/2023.  General: NAD HEENT: normocephalic, anicteric Thyroid: no enlargement, no palpable nodules Pulmonary: No increased work of breathing, CTAB Cardiovascular: RRR, distal pulses 2+ Breast: Breast symmetrical, no tenderness, no palpable nodules or masses, no skin or nipple retraction present, no nipple discharge.  No axillary or supraclavicular lymphadenopathy. Abdomen: NABS, soft, non-tender, non-distended.  Umbilicus without lesions.  No hepatomegaly, splenomegaly or masses palpable. No evidence of hernia  Genitourinary:  External: Normal external female genitalia.  Normal urethral meatus, normal Bartholin's and Skene's glands.    Vagina: Normal vaginal mucosa, no evidence of prolapse.    Cervix: Grossly normal in appearance, no bleeding  Uterus: Non-enlarged, mobile, normal contour.  No CMT  Adnexa: ovaries non-enlarged, no adnexal masses  Rectal: deferred  Lymphatic: no evidence of inguinal lymphadenopathy Extremities: no edema, erythema, or tenderness Neurologic: Grossly intact Psychiatric: mood appropriate, affect full  Female chaperone present for pelvic and breast  portions of the physical exam    Assessment: 28 y.o. No obstetric history on file. routine annual exam  Plan: Problem List Items Addressed This Visit   None Visit Diagnoses       Well woman exam    -  Primary   Relevant Orders   Cytology - PAP   HEP, RPR, HIV Panel   Hepatitis C Antibody   Hemoglobin  A1c   Lipid panel     Cervical cancer screening       Relevant Orders   Cytology - PAP     Screening examination for venereal disease       Relevant Orders   Cytology - PAP   HEP, RPR, HIV Panel   Hepatitis C Antibody     Screening for diabetes mellitus       Relevant Orders   Hemoglobin A1c     Cholera screening         Screening for cholesterol level       Relevant Orders   Lipid panel       1) 4) Gardasil Series discussed and if applicable offered to patient - Patient {HAS HAS ION:62952} previously completed 3 shot series   2) STI screening  {Blank single:19197::"was","was not"}offered and {Blank single:19197::"accepted","declined","therefore not obtained"}  3)  ASCCP guidelines and rational discussed.  Patient opts for {Blank single:19197::"***","every 5 years","every 3 years","yearly","discontinue age >65","discontinue secondary to prior hysterectomy"} screening interval  4) Contraception - the patient is currently using  {method:5051}.  She is {Blank single:19197::"happy with her current form of contraception and plans to continue","interested in changing to ***","interested in starting Contraception: ***","not  currently in need of contraception secondary to being sterile","attempting to conceive in the near future"} We discussed safe sex practices to reduce her furture risk of STI's.    5) No follow-ups on file.   Carie Caddy, CNM  [facility], [facility] 10/10/2023, 9:42 AM

## 2023-10-11 ENCOUNTER — Encounter: Payer: Self-pay | Admitting: Licensed Practical Nurse

## 2023-10-11 LAB — LIPID PANEL
Chol/HDL Ratio: 2.6 {ratio} (ref 0.0–4.4)
Cholesterol, Total: 209 mg/dL — ABNORMAL HIGH (ref 100–199)
HDL: 80 mg/dL (ref >39)
LDL Chol Calc (NIH): 113 mg/dL — ABNORMAL HIGH (ref 0–99)
Triglycerides: 90 mg/dL (ref 0–149)
VLDL Cholesterol Cal: 16 mg/dL (ref 5–40)

## 2023-10-11 LAB — HEMOGLOBIN A1C
Est. average glucose Bld gHb Est-mCnc: 100 mg/dL
Hgb A1c MFr Bld: 5.1 % (ref 4.8–5.6)

## 2023-10-11 LAB — HEP, RPR, HIV PANEL
HIV Screen 4th Generation wRfx: NONREACTIVE
Hepatitis B Surface Ag: NEGATIVE
RPR Ser Ql: NONREACTIVE

## 2023-10-11 LAB — HEPATITIS C ANTIBODY: Hep C Virus Ab: NONREACTIVE

## 2023-10-11 MED ORDER — MICROGESTIN 1/20 1-20 MG-MCG PO TABS: 1.0000 | ORAL_TABLET | Freq: Every day | ORAL | 12 refills | Status: AC

## 2023-10-17 LAB — CYTOLOGY - PAP
Chlamydia: NEGATIVE
Comment: NEGATIVE
Comment: NEGATIVE
Comment: NORMAL
Diagnosis: NEGATIVE
Neisseria Gonorrhea: NEGATIVE
Trichomonas: NEGATIVE

## 2024-03-05 ENCOUNTER — Telehealth: Payer: Self-pay | Admitting: Licensed Practical Nurse

## 2024-03-05 NOTE — Telephone Encounter (Signed)
 Received notification from [facility] regarding use of estrogen and Lamotrigine, estrogen can decrease the concentration of Lamotrigine. At the time of Erin Pugh's visit, Lamotrigine was not on medication list.  TC: Erin Pugh reports that she has been on OCP's since she was 16 and the Lamotrigine for the last 2 years, her neurologist is aware that she is on OCP's.  No further actions needed at the time. Lamotrigine added to pt's medication list.  Erin Pugh, CNM  [facility]  03/05/2024 5:09 PM

## 2024-03-10 ENCOUNTER — Ambulatory Visit
Admission: RE | Admit: 2024-03-10 | Discharge: 2024-03-10 | Disposition: A | Discharge status: Home / Self Care | Source: Ambulatory Visit | Attending: Emergency Medicine | Admitting: Emergency Medicine

## 2024-03-10 VITALS — BP 117/93 | HR 84 | Temp 98.3°F | Resp 14 | Ht 64.0 in | Wt 145.0 lb

## 2024-03-10 DIAGNOSIS — Z3202 Encounter for pregnancy test, result negative: Secondary | ICD-10-CM | POA: Diagnosis not present

## 2024-03-10 DIAGNOSIS — R1031 Right lower quadrant pain: Secondary | ICD-10-CM | POA: Diagnosis not present

## 2024-03-10 LAB — URINALYSIS, W/ REFLEX TO CULTURE (INFECTION SUSPECTED)
Bilirubin Urine: NEGATIVE
Glucose, UA: NEGATIVE mg/dL
Hgb urine dipstick: NEGATIVE
Ketones, ur: NEGATIVE mg/dL
Leukocytes,Ua: NEGATIVE
Nitrite: NEGATIVE
Protein, ur: NEGATIVE mg/dL
Specific Gravity, Urine: 1.025 (ref 1.005–1.030)
pH: 7 (ref 5.0–8.0)

## 2024-03-10 LAB — PREGNANCY, URINE: Preg Test, Ur: NEGATIVE

## 2024-03-10 MED ORDER — KETOROLAC TROMETHAMINE 30 MG/ML IJ SOLN
60.0000 mg | Freq: Once | INTRAMUSCULAR | Status: AC
  Administered 2024-03-10: 60 mg via INTRAMUSCULAR

## 2024-03-10 NOTE — Discharge Instructions (Signed)
 I am primarily concerned that you have appendicitis.  Also in the differential is ovarian torsion, PID, microperforation, colitis, diverticulitis.  Do not have anything to eat or drink until your ER evaluation is complete.  Your urinalysis was negative here and your urine pregnancy is negative as well.  I have given you 30 mg of Toradol  here for pain.  Let them know if your pain changes or gets worse

## 2024-03-10 NOTE — ED Triage Notes (Signed)
 Patient c/o lower abdominal pain that started yesterday.  Patient denies V/D.  Patient reports nausea yesterday.  Patient denies urinary symptoms.  Patient denies any vaginal discharge.  Patient states that her menstrual cycle ended yesterday.

## 2024-03-10 NOTE — ED Provider Notes (Signed)
 HPI  SUBJECTIVE:  Erin Pugh is a 28 y.o. female who presents with diffuse dull, constant low abdominal pain starting yesterday.  States that she feels bloated.  She thought it was cramps at first.  She reports nausea, watery nonbloody diarrhea last night.  There was no change in her abdominal pain after stooling.  She states the pain is not getting worse, but it is not resolving.  No fevers, vomiting, back pain, abdominal distention, melena, anorexia, urinary complaints, vaginal odor, bleeding, discharge.  She just finished menses yesterday.  She is in a long-term monogamous relationship with a female, who is asymptomatic.  STDs are not a concern today.  She took Midol, last dose was within 6 hours without improvement in her symptoms.  She has also tried rest.  Symptoms are better with lying down, pressing a pillow on her abdomen, worse with movement and walking.  It is not associated with p.o. intake, urination or defecation.  She states that the car ride over here was painful.  She had symptoms like this before 6 or 7 years ago, but no identifiable cause found.  She is status post left inguinal hernia repair.  She has not noticed any bulging in this area.  No history of UTI, pyelonephritis, nephrolithiasis, STDs, BV, yeast, endometriosis, PCOS, diverticulitis, ovarian torsion, TOA, PID, obstruction, perforation, mesenteric ischemia, atrial fibrillation.  LMP: Ended yesterday.  PCP: Not    Past Medical History:  Diagnosis Date   Anxiety    Crohn's disease (HCC)    Seizures (HCC)    Syncope     Past Surgical History:  Procedure Laterality Date   COLONOSCOPY WITH PROPOFOL  N/A 10/19/2020   Procedure: COLONOSCOPY WITH PROPOFOL ;  Surgeon: Toledo, Ladell POUR, MD;  Location: [facility];  Service: Gastroenterology;  Laterality: N/A;   ESOPHAGOGASTRODUODENOSCOPY (EGD) WITH PROPOFOL  N/A 10/19/2020   Procedure: ESOPHAGOGASTRODUODENOSCOPY (EGD) WITH PROPOFOL ;  Surgeon: Toledo, Ladell POUR, MD;   Location: [facility];  Service: Gastroenterology;  Laterality: N/A;   HIP SURGERY     ORIF MANDIBULAR FRACTURE N/A 07/05/2022   Procedure: OPEN REDUCTION INTERNAL FIXATION (ORIF) MANDIBULAR FRACTURE - SYMPHYSIS;  Surgeon: Helga Bettyann SQUIBB, DMD;  Location: [facility];  Service: Oral Surgery;  Laterality: N/A;   WISDOM TOOTH EXTRACTION      Family History  Problem Relation Age of Onset   Heart failure Father    Hyperlipidemia Father    Hypertension Father    Cancer Maternal Grandmother    Hypertension Paternal Grandmother    Hyperlipidemia Paternal Grandmother    Heart failure Paternal Grandmother    CAD Paternal Grandmother    Hypertension Paternal Grandfather    Heart failure Paternal Grandfather    Hyperlipidemia Paternal Grandfather     Social History   Tobacco Use   Smoking status: Former    Current packs/day: 0.50    Types: Cigarettes, E-cigarettes   Smokeless tobacco: Never  Vaping Use   Vaping status: Never Used  Substance Use Topics   Alcohol use: Not Currently   Drug use: Not Currently     Current Facility-Administered Medications:    ketorolac  (TORADOL ) 30 MG/ML injection 60 mg, 60 mg, Intramuscular, Once, Van Knee, MD  Current Outpatient Medications:    lamoTRIgine (LAMICTAL) 200 MG tablet, Take 200 mg by mouth daily., Disp: , Rfl:    MICROGESTIN  1-20 MG-MCG tablet, Take 1 tablet by mouth daily., Disp: 28 tablet, Rfl: 12   venlafaxine XR (EFFEXOR-XR) 75 MG 24 hr capsule, Take 1 capsule (75 mg  total) by mouth daily., Disp: , Rfl:    valACYclovir (VALTREX) 1000 MG tablet, Take 1,000 mg by mouth daily., Disp: , Rfl:   Allergies  Allergen Reactions   Hydrocodone Itching   Keppra  [Levetiracetam ] Other (See Comments)    suicidal   Omnicef [Cefdinir] Other (See Comments)    Redness   Promethazine Other (See Comments)    seizure     ROS  As noted in HPI.   Physical Exam  BP (!) 117/93 (BP Location: Left Arm)   Pulse 84   Temp 98.3 F (36.8  C) (Oral)   Resp 14   Ht 5' 4 (1.626 m)   Wt 65.8 kg   LMP 03/04/2024 (Approximate)   SpO2 100%   BMI 24.89 kg/m   Constitutional: Well developed, well nourished, no acute distress Eyes: PERRL, EOMI, conjunctiva normal bilaterally HENT: Normocephalic, atraumatic,mucus membranes moist Respiratory: Clear to auscultation bilaterally, no rales, no wheezing, no rhonchi Cardiovascular: Normal rate and rhythm, no murmurs, no gallops, no rubs GI: Normal appearance.  Soft.  Positive right lower quadrant tenderness with mild rebound.  Negative Rovsing.  Active bowel sounds.  No guarding.  Positive tap table test. Back: no CVAT skin: No rash, skin intact Musculoskeletal: No edema, no tenderness, no deformities Neurologic: Alert & oriented x 3, CN III-XII grossly intact, no motor deficits, sensation grossly intact Psychiatric: Speech and behavior appropriate   ED Course   Medications  ketorolac  (TORADOL ) 30 MG/ML injection 60 mg (has no administration in time range)    Orders Placed This Encounter  Procedures   Urinalysis, w/ Reflex to Culture (Infection Suspected) -Urine, Clean Catch    Standing Status:   Standing    Number of Occurrences:   1    Specimen Source:   Urine, Clean Catch [76]   Pregnancy, urine    Standing Status:   Standing    Number of Occurrences:   1   Results for orders placed or performed during the hospital encounter of 03/10/24 (from the past 24 hours)  Urinalysis, w/ Reflex to Culture (Infection Suspected) -Urine, Clean Catch     Status: Abnormal   Collection Time: 03/10/24 11:32 AM  Result Value Ref Range   Specimen Source URINE, CLEAN CATCH    Color, Urine YELLOW YELLOW   APPearance CLEAR CLEAR   Specific Gravity, Urine 1.025 1.005 - 1.030   pH 7.0 5.0 - 8.0   Glucose, UA NEGATIVE NEGATIVE mg/dL   Hgb urine dipstick NEGATIVE NEGATIVE   Bilirubin Urine NEGATIVE NEGATIVE   Ketones, ur NEGATIVE NEGATIVE mg/dL   Protein, ur NEGATIVE NEGATIVE mg/dL    Nitrite NEGATIVE NEGATIVE   Leukocytes,Ua NEGATIVE NEGATIVE   Squamous Epithelial / HPF 0-5 0 - 5 /HPF   WBC, UA 0-5 0 - 5 WBC/hpf   RBC / HPF 0-5 0 - 5 RBC/hpf   Bacteria, UA FEW (A) NONE SEEN   WBC Clumps PRESENT   Pregnancy, urine     Status: None   Collection Time: 03/10/24 11:32 AM  Result Value Ref Range   Preg Test, Ur NEGATIVE NEGATIVE   No results found.  ED Clinical Impression  1. Right lower quadrant abdominal pain      ED Assessment/Plan     Patient presents with low midline abdominal pain, and right lower quadrant tenderness with mild rebound and other peritoneal signs.  Concern for appendicitis.  Ovarian torsion in the differential.  Also differential is diverticulitis, colitis, ruptured ovarian cyst, microperforation.  Doubt obstruction, TOA.  She is not pregnant.  Her UA is negative for urinary tract infection.  Giving 30 mg of Toradol  IM here.  Transferring to the emergency department for comprehensive evaluation to rule out appendicitis, but consider torsion.  Advised patient to be n.p.o. until ER evaluation is complete.  She is stable to go to the ED via private vehicle.  Gave report to charge nurse.  Discussed labs,  MDM, treatment plan, and plan for follow-up with patient Discussed sn/sx that should prompt return to the ED. patient agrees with plan.   Meds ordered this encounter  Medications   ketorolac  (TORADOL ) 30 MG/ML injection 60 mg      *This clinic note was created using Scientist, clinical (histocompatibility and immunogenetics). Therefore, there may be occasional mistakes despite careful proofreading. ?    Van Knee, MD 03/10/24 1309

## 2024-03-10 NOTE — ED Notes (Signed)
 Patient is being discharged from the Urgent Care and sent to the [facility] Emergency Department via private vehicle . Per Dr. Van, patient is in need of higher level of care due to lower abdominal pain. Patient is aware and verbalizes understanding of plan of care.  Vitals:   03/10/24 1130  BP: (!) 117/93  Pulse: 84  Resp: 14  Temp: 98.3 F (36.8 C)  SpO2: 100%

## 2024-03-18 ENCOUNTER — Emergency Department
Admission: EM | Admit: 2024-03-18 | Discharge: 2024-03-18 | Disposition: A | Discharge status: Home / Self Care | Attending: Emergency Medicine | Admitting: Emergency Medicine

## 2024-03-18 ENCOUNTER — Other Ambulatory Visit: Payer: Self-pay

## 2024-03-18 ENCOUNTER — Emergency Department

## 2024-03-18 DIAGNOSIS — K625 Hemorrhage of anus and rectum: Secondary | ICD-10-CM | POA: Diagnosis present

## 2024-03-18 DIAGNOSIS — K5000 Crohn's disease of small intestine without complications: Secondary | ICD-10-CM | POA: Insufficient documentation

## 2024-03-18 DIAGNOSIS — R1031 Right lower quadrant pain: Secondary | ICD-10-CM

## 2024-03-18 LAB — URINALYSIS, ROUTINE W REFLEX MICROSCOPIC
Bilirubin Urine: NEGATIVE
Glucose, UA: NEGATIVE mg/dL
Hgb urine dipstick: NEGATIVE
Ketones, ur: NEGATIVE mg/dL
Leukocytes,Ua: NEGATIVE
Nitrite: NEGATIVE
Protein, ur: NEGATIVE mg/dL
Specific Gravity, Urine: 1.025 (ref 1.005–1.030)
pH: 8 (ref 5.0–8.0)

## 2024-03-18 LAB — CBC
HCT: 35.1 % — ABNORMAL LOW (ref 36.0–46.0)
Hemoglobin: 11.6 g/dL — ABNORMAL LOW (ref 12.0–15.0)
MCH: 31.4 pg (ref 26.0–34.0)
MCHC: 33 g/dL (ref 30.0–36.0)
MCV: 94.9 fL (ref 80.0–100.0)
Platelets: 248 10*3/uL (ref 150–400)
RBC: 3.7 MIL/uL — ABNORMAL LOW (ref 3.87–5.11)
RDW: 11.8 % (ref 11.5–15.5)
WBC: 4.9 10*3/uL (ref 4.0–10.5)
nRBC: 0 % (ref 0.0–0.2)

## 2024-03-18 LAB — COMPREHENSIVE METABOLIC PANEL WITH GFR
ALT: 18 U/L (ref 0–44)
AST: 20 U/L (ref 15–41)
Albumin: 3.7 g/dL (ref 3.5–5.0)
Alkaline Phosphatase: 66 U/L (ref 38–126)
Anion gap: 7 (ref 5–15)
BUN: 13 mg/dL (ref 6–20)
CO2: 23 mmol/L (ref 22–32)
Calcium: 8.4 mg/dL — ABNORMAL LOW (ref 8.9–10.3)
Chloride: 108 mmol/L (ref 98–111)
Creatinine, Ser: 0.62 mg/dL (ref 0.44–1.00)
GFR, Estimated: >60 mL/min (ref >60)
Glucose, Bld: 96 mg/dL (ref 70–99)
Potassium: 3.5 mmol/L (ref 3.5–5.1)
Sodium: 138 mmol/L (ref 135–145)
Total Bilirubin: 0.6 mg/dL (ref 0.0–1.2)
Total Protein: 6.7 g/dL (ref 6.5–8.1)

## 2024-03-18 LAB — LIPASE, BLOOD: Lipase: 28 U/L (ref 11–51)

## 2024-03-18 LAB — TYPE AND SCREEN
ABO/RH(D): O POS
Antibody Screen: NEGATIVE

## 2024-03-18 LAB — POC URINE PREG, ED: Preg Test, Ur: NEGATIVE

## 2024-03-18 MED ORDER — IOHEXOL 350 MG/ML SOLN
100.0000 mL | Freq: Once | INTRAVENOUS | Status: AC | PRN
  Administered 2024-03-18: 100 mL via INTRAVENOUS

## 2024-03-18 MED ORDER — SODIUM CHLORIDE 0.9 % IV BOLUS
1000.0000 mL | Freq: Once | INTRAVENOUS | Status: AC
  Administered 2024-03-18: 1000 mL via INTRAVENOUS

## 2024-03-18 MED ORDER — KETOROLAC TROMETHAMINE 30 MG/ML IJ SOLN: 15.0000 mg | Freq: Once | INTRAMUSCULAR | Status: DC

## 2024-03-18 MED ORDER — PREDNISONE 10 MG PO TABS: ORAL_TABLET | ORAL | 0 refills | Status: AC

## 2024-03-18 MED ORDER — ONDANSETRON HCL 4 MG/2ML IJ SOLN
4.0000 mg | Freq: Once | INTRAMUSCULAR | Status: AC
  Administered 2024-03-18: 4 mg via INTRAVENOUS
  Filled 2024-03-18: qty 2

## 2024-03-18 NOTE — ED Notes (Signed)
 2 failed attempts for IV stick in right AC. One by this RN and one by Noemi, Charity fundraiser. Successful IV started in left [facility] by this RN. Pt tolerated well.

## 2024-03-18 NOTE — ED Provider Notes (Signed)
 [facility] Provider Note    Event Date/Time   First MD Initiated Contact with Patient 03/18/24 1058     (approximate)   History   Abdominal Pain and Rectal Bleeding   HPI  Erin Pugh is a 28 year old female with history of Crohn's disease presenting to the emergency department for evaluation of abdominal pain and rectal bleeding.  Over the past week, patient has had intermittent episodes of lower abdominal pain worse on the right side with associated rectal bleeding.  Initially just noticed a small amount of blood when she wiped, more recently has had increased bleeding with formed stool.  No fevers.  Reports she has not been on Crohn's medication for several years.  Says she is unsure if she truly has Crohn's disease that she did have a colonoscopy without evidence of this.    I was able to review colonoscopy from 10/19/2020 performed by Dr. Aundria.She did have a normal ileum and colon, with recommended continued follow-up as an outpatient.  I do not see that patient has seen GI since that time.      Physical Exam   Triage Vital Signs: ED Triage Vitals  Encounter Vitals Group     BP 03/18/24 1044 (!) 134/94     Girls Systolic BP Percentile --      Girls Diastolic BP Percentile --      Boys Systolic BP Percentile --      Boys Diastolic BP Percentile --      Pulse Rate 03/18/24 1044 81     Resp 03/18/24 1044 18     Temp 03/18/24 1044 98 F (36.7 C)     Temp Source 03/18/24 1044 Oral     SpO2 03/18/24 1044 99 %     Weight 03/18/24 1050 145 lb 8.1 oz (66 kg)     Height 03/18/24 1050 5' 4 (1.626 m)     Head Circumference --      Peak Flow --      Pain Score 03/18/24 1047 5     Pain Loc --      Pain Education --      Exclude from Growth Chart --     Most recent vital signs: Vitals:   03/18/24 1140 03/18/24 1200  BP: (!) 128/93 125/79  Pulse: 90 95  Resp: 18 18  Temp:    SpO2: 100% 100%     General: Awake, interactive   CV:  Regular rate, good peripheral perfusion.  Resp:  Unlabored respirations.  Abd:  Nondistended,soft, tenderness palpation most notably in the right lower quadrant Neuro:  Symmetric facial movement, fluid speech   ED Results / Procedures / Treatments   Labs (all labs ordered are listed, but only abnormal results are displayed) Labs Reviewed  COMPREHENSIVE METABOLIC PANEL WITH GFR - Abnormal; Notable for the following components:      Result Value   Calcium 8.4 (*)    All other components within normal limits  CBC - Abnormal; Notable for the following components:   RBC 3.70 (*)    Hemoglobin 11.6 (*)    HCT 35.1 (*)    All other components within normal limits  URINALYSIS, ROUTINE W REFLEX MICROSCOPIC - Abnormal; Notable for the following components:   Color, Urine YELLOW (*)    APPearance CLEAR (*)    All other components within normal limits  LIPASE, BLOOD  POC URINE PREG, ED  TYPE AND SCREEN     EKG EKG independently reviewed  and interpreted by myself demonstrates:    RADIOLOGY Imaging independently reviewed and interpreted by myself demonstrates:   Formal Radiology Read:  CT ANGIO GI BLEED Result Date: 03/18/2024 CLINICAL DATA:  Right lower quadrant pain, GI bleed rectal bleeding for 1 week, history of Crohn's disease EXAM: CTA ABDOMEN AND PELVIS WITHOUT AND WITH CONTRAST TECHNIQUE: Multidetector CT imaging of the abdomen and pelvis was performed using the standard protocol during bolus administration of intravenous contrast. Multiplanar reconstructed images and MIPs were obtained and reviewed to evaluate the vascular anatomy. RADIATION DOSE REDUCTION: This exam was performed according to the departmental dose-optimization program which includes automated exposure control, adjustment of the mA and/or kV according to patient size and/or use of iterative reconstruction technique. CONTRAST:  100mL OMNIPAQUE IOHEXOL 350 MG/ML SOLN COMPARISON:  None Available. FINDINGS:  VASCULAR Normal contour and caliber of the abdominal aorta. No evidence of aneurysm, dissection, or other acute aortic pathology. Standard branching pattern of the abdominal aorta with solitary bilateral renal arteries. Review of the MIP images confirms the above findings. NON-VASCULAR Lower Chest: No acute findings. Hepatobiliary: No solid liver abnormality is seen. No gallstones, gallbladder wall thickening, or biliary dilatation. Pancreas: Unremarkable. No pancreatic ductal dilatation or surrounding inflammatory changes. Spleen: Normal in size without significant abnormality. Adrenals/Urinary Tract: Adrenal glands are unremarkable. Kidneys are normal, without renal calculi, solid lesion, or hydronephrosis. Bladder is unremarkable. Stomach/Bowel: Stomach is within normal limits. Appendix appears normal. Mildly enhancing, tethered appearing loops of terminal ileum in the right lower quadrant, somewhat unusual medial configuration of the cecum (series 16, image 47). No evidence of bowel obstruction. Moderate burden of stool throughout the colon and rectum. No intraluminal contrast extravasation or other findings to localize GI bleeding. Lymphatic: No enlarged abdominal or pelvic lymph nodes. Reproductive: No mass or other significant abnormality. Benign functional ovarian cysts and follicles requiring no further follow-up or characterization. Other: No abdominal wall hernia or abnormality. No ascites. Musculoskeletal: No acute osseous findings. IMPRESSION: 1. Mildly enhancing, tethered appearing loops of terminal ileum in the right lower quadrant, in keeping with Crohn's disease and some evidence of active ileitis. No evidence of obstruction or other complication. 2. No intraluminal contrast extravasation or other findings to specifically localize GI bleeding. 3. Normal contour and caliber of the abdominal aorta. No evidence of aneurysm, dissection, or other acute aortic pathology. No significant atherosclerosis. 4.  Normal appendix. Electronically Signed   By: Marolyn JONETTA Jaksch M.D.   On: 03/18/2024 12:55    PROCEDURES:  Critical Care performed: No  Procedures   MEDICATIONS ORDERED IN ED: Medications  ondansetron  (ZOFRAN ) injection 4 mg (4 mg Intravenous Given 03/18/24 1150)  sodium chloride  0.9 % bolus 1,000 mL (1,000 mLs Intravenous New Bag/Given 03/18/24 1151)  iohexol (OMNIPAQUE) 350 MG/ML injection 100 mL (100 mLs Intravenous Contrast Given 03/18/24 1218)     IMPRESSION / MDM / ASSESSMENT AND PLAN / ED COURSE  I reviewed the triage vital signs and the nursing notes.  Differential diagnosis includes, but is not limited to, Crohn's flare, appendicitis, colitis, other acute intra-abdominal process  Patient's presentation is most consistent with acute presentation with potential threat to life or bodily function.  28 year old female presenting with abdominal pain and rectal bleeding.  Stable vitals on presentation.  Labs with normal white blood cell count, mild anemia with hemoglobin 11.6, limited priors to baseline appears to be between 11 and 13.  CMP without significant derangement.  Negative lipase.  UPT negative.  UA without evidence of infection.  CTA demonstrates  enhancing loops of bowel in the terminal ileum with evidence of active ileitis, no active contrast extravasation.  Presentation seems most consistent with Crohn's flare.  Patient was given Zofran  with improvement in her nausea, declined pain medication.  I did review the case with GI as the patient is not currently on any treatment for her possible Crohn's.  Dr. Edith did recommend steroid taper over the next 8 weeks and outpatient follow-up with GI.  Discussed this plan with patient who is comfortable with this.  Strict return precautions provided.  Patient discharged stable condition.     FINAL CLINICAL IMPRESSION(S) / ED DIAGNOSES   Final diagnoses:  Ileitis, terminal, without complications (HCC)  Right lower quadrant abdominal pain   Rectal bleeding     Rx / DC Orders   ED Discharge Orders          Ordered    predniSONE (DELTASONE) 10 MG tablet  Daily        03/18/24 1344             Note:  This document was prepared using Dragon voice recognition software and may include unintentional dictation errors.   Levander Slate, MD 03/18/24 1344

## 2024-03-18 NOTE — Discharge Instructions (Signed)
 You were seen in the emergency department today for evaluation of your abdominal pain and rectal bleeding.  Your CT showed inflammation of your bowel known as ileitis.  This can be associated with Crohn's disease.  I sent a course of a steroid taper to your pharmacy.  Please take this as directed.  Please arrange follow-up with GI as soon as possible.  Return to the ER for any new or worsening symptoms.

## 2024-03-18 NOTE — ED Triage Notes (Signed)
 To ED for rectal bleeding since 1 week, bright red, and RLQ abdominal pain since 1 week. Also nauseous and bloated. Has appendix and GB. Denies dysuria. Hx Crohns. LBM 2 hours ago, well formed but bloody.

## 2024-03-18 NOTE — ED Notes (Signed)
 Pt returned from CT

## 2024-03-26 ENCOUNTER — Ambulatory Visit: Admitting: Licensed Practical Nurse

## 2024-03-26 ENCOUNTER — Encounter: Payer: Self-pay | Admitting: Licensed Practical Nurse

## 2024-03-26 ENCOUNTER — Other Ambulatory Visit (HOSPITAL_COMMUNITY)
Admission: RE | Admit: 2024-03-26 | Discharge: 2024-03-26 | Disposition: A | Discharge status: Home / Self Care | Source: Ambulatory Visit | Attending: Licensed Practical Nurse | Admitting: Licensed Practical Nurse

## 2024-03-26 VITALS — BP 148/90 | HR 115 | Ht 64.0 in | Wt 153.8 lb

## 2024-03-26 DIAGNOSIS — Z113 Encounter for screening for infections with a predominantly sexual mode of transmission: Secondary | ICD-10-CM | POA: Insufficient documentation

## 2024-03-26 DIAGNOSIS — Z3202 Encounter for pregnancy test, result negative: Secondary | ICD-10-CM | POA: Diagnosis not present

## 2024-03-26 DIAGNOSIS — R1031 Right lower quadrant pain: Secondary | ICD-10-CM

## 2024-03-26 DIAGNOSIS — Z3200 Encounter for pregnancy test, result unknown: Secondary | ICD-10-CM

## 2024-03-26 DIAGNOSIS — N921 Excessive and frequent menstruation with irregular cycle: Secondary | ICD-10-CM | POA: Diagnosis not present

## 2024-03-26 LAB — POCT URINE PREGNANCY: Preg Test, Ur: NEGATIVE

## 2024-03-26 NOTE — Progress Notes (Signed)
 System, Provider Not In   No chief complaint on file.   HPI:      Erin Pugh is a 28 y.o. [id number] whose LMP was Patient's last menstrual period was 03/04/2024 (approximate)., presents today for right sided pelvic pain. Reports she has been diagnosed with Chron's Disease via a colonscopy years ago and then had a repeat one years later and it was negative. Reports she's works at [employer] and has to bend and lift heavy things for 10 hours a day. This pain started 3 or 4 weeks ago, is dull constant and she often has to leave work or call out due to the pain.Reports history of hernia to the left side in the past and states this pain is similar. She had a recent ED visit on 6/30 with CT scan that showed an inflamed ileus. States my stomach has been tore up my whole life, she reports having bloody stool a week ago but nothing since. Reports ibuprofen use but not recently.   Also, her menses for the past 4 out of 6 months have been irregular, bleeding twice a month for 5 days, painful cramping and heavy periods. Has to change a regular sized pad multiple times a day. Takes COC's. Denies any new stressors, lifestyle habits.     Patient Active Problem List   Diagnosis Date Noted   Hospice care patient 07/05/2022    Past Surgical History:  Procedure Laterality Date   COLONOSCOPY WITH PROPOFOL  N/A 10/19/2020   Procedure: COLONOSCOPY WITH PROPOFOL ;  Surgeon: Toledo, Ladell POUR, MD;  Location: [facility] END[facility]COPY;  Service: Gastroenterology;  Laterality: N/A;   ESOPHAGOGASTRODUODENOSCOPY (EGD) WITH PROPOFOL  N/A 10/19/2020   Procedure: ESOPHAGOGASTRODUODENOSCOPY (EGD) WITH PROPOFOL ;  Surgeon: Toledo, Ladell POUR, MD;  Location: [facility] END[facility]COPY;  Service: Gastroenterology;  Laterality: N/A;   HIP SURGERY     ORIF MANDIBULAR FRACTURE N/A 07/05/2022   Procedure: OPEN REDUCTION INTERNAL FIXATION (ORIF) MANDIBULAR FRACTURE - SYMPHYSIS;  Surgeon: Helga Bettyann SQUIBB, DMD;  Location: [facility] OR;   Service: Oral Surgery;  Laterality: N/A;   WISDOM TOOTH EXTRACTION      Family History  Problem Relation Age of Onset   Heart failure Father    Hyperlipidemia Father    Hypertension Father    Cancer Maternal Grandmother    Hypertension Paternal Grandmother    Hyperlipidemia Paternal Grandmother    Heart failure Paternal Grandmother    CAD Paternal Grandmother    Hypertension Paternal Grandfather    Heart failure Paternal Grandfather    Hyperlipidemia Paternal Grandfather     Social History   Socioeconomic History   Marital status: Single    Spouse name: Not on file   Number of children: Not on file   Years of education: Not on file   Highest education level: Not on file  Occupational History   Not on file  Tobacco Use   Smoking status: Former    Current packs/day: 0.50    Types: Cigarettes, E-cigarettes   Smokeless tobacco: Never  Vaping Use   Vaping status: Never Used  Substance and Sexual Activity   Alcohol use: Not Currently   Drug use: Not Currently   Sexual activity: Not Currently    Birth control/protection: Pill  Other Topics Concern   Not on file  Social History Narrative   Not on file   Social Drivers of Health   Financial Resource Strain: Not on file  Food Insecurity: Not on file  Transportation Needs: Not on file  Physical Activity: Not on file  Stress: Not on file  Social Connections: Not on file  Intimate Partner Violence: Not on file    Outpatient Medications Prior to Visit  Medication Sig Dispense Refill   lamoTRIgine (LAMICTAL) 200 MG tablet Take 200 mg by mouth daily.     MICROGESTIN  1-20 MG-MCG tablet Take 1 tablet by mouth daily. 28 tablet 12   predniSONE  (DELTASONE ) 10 MG tablet Take 4 tablets (40 mg total) by mouth daily for 7 days, THEN 3 tablets (30 mg total) daily for 7 days, THEN 2 tablets (20 mg total) daily for 7 days, THEN 1 tablet (10 mg total) daily for 7 days. 70 tablet 0   valACYclovir (VALTREX) 1000 MG tablet Take 1,000 mg  by mouth daily.     venlafaxine XR (EFFEXOR-XR) 75 MG 24 hr capsule Take 1 capsule (75 mg total) by mouth daily.     No facility-administered medications prior to visit.      ROS:  Review of Systems  Constitutional: Negative.   HENT: Negative.    Eyes: Negative.   Respiratory: Negative.    Cardiovascular: Negative.   Gastrointestinal:  Positive for abdominal pain.  Endocrine: Negative.   Genitourinary:  Positive for menstrual problem.  Skin: Negative.   Allergic/Immunologic: Negative.   Neurological: Negative.   Hematological: Negative.   Psychiatric/Behavioral: Negative.       OBJECTIVE:   Vitals:  BP (!) 148/90 (BP Location: Right Arm, Patient Position: Sitting, Cuff Size: Normal)   Pulse (!) 115   Ht 5' 4 (1.626 m)   Wt 69.8 kg   LMP 03/04/2024 (Approximate)   BMI 26.40 kg/m   Physical Exam Constitutional:      Appearance: Normal appearance.  HENT:     Head: Normocephalic.     Nose: Nose normal.     Mouth/Throat:     Mouth: Mucous membranes are moist.     Pharynx: Oropharynx is clear.  Eyes:     Pupils: Pupils are equal, round, and reactive to light.  Cardiovascular:     Heart sounds: Normal heart sounds.  Pulmonary:     Effort: Pulmonary effort is normal.     Breath sounds: Normal breath sounds.  Abdominal:     Tenderness: There is abdominal tenderness. There is guarding.     Comments: Rebound tenderness   Genitourinary:    General: Normal vulva.     Rectum: Normal.     Comments: Bimanual exam: uterus motion tenderness, guarding, no masses. Adnexal tenderness, no masses. No gravid uterus.  Musculoskeletal:        General: Normal range of motion.     Cervical back: Normal range of motion.  Skin:    General: Skin is warm.  Neurological:     Mental Status: She is alert and oriented to person, place, and time.  Psychiatric:        Mood and Affect: Mood normal.        Behavior: Behavior normal.     Results: No results found for this or any  previous visit (from the past 24 hours).   Assessment/Plan: Possible pregnancy - Plan: POCT urine pregnancy  Menorrhagia with irregular cycle - Plan: US  PELVIC COMPLETE WITH TRANSVAGINAL, CANCELED: US  PELVIS TRANSVAGINAL NON-OB (TV ONLY)  Screening examination for STD (sexually transmitted disease) - Plan: Cervicovaginal ancillary only  STI testing done today.   R/O after pelvic and abdominal ultrasound to discuss options. Referral to PCP. Discussed changing birth control to manage painful and heavy  periods. Opts to wait for ultrasound and go from there.  Elevated BP In clinic today, attributed  to pain.   No orders of the defined types were placed in this encounter.    Marybelle Grayer, Student-MidWife 03/26/2024 4:58 PM

## 2024-03-27 DIAGNOSIS — Z0289 Encounter for other administrative examinations: Secondary | ICD-10-CM

## 2024-03-28 LAB — CERVICOVAGINAL ANCILLARY ONLY
Bacterial Vaginitis (gardnerella): NEGATIVE
Candida Glabrata: NEGATIVE
Candida Vaginitis: NEGATIVE
Chlamydia: NEGATIVE
Comment: NEGATIVE
Comment: NEGATIVE
Comment: NEGATIVE
Comment: NEGATIVE
Comment: NEGATIVE
Comment: NORMAL
Neisseria Gonorrhea: NEGATIVE
Trichomonas: NEGATIVE

## 2024-04-04 ENCOUNTER — Ambulatory Visit

## 2024-04-04 ENCOUNTER — Other Ambulatory Visit: Payer: Self-pay | Admitting: Licensed Practical Nurse

## 2024-04-04 ENCOUNTER — Encounter: Payer: Self-pay | Admitting: Licensed Practical Nurse

## 2024-04-04 DIAGNOSIS — Z113 Encounter for screening for infections with a predominantly sexual mode of transmission: Secondary | ICD-10-CM

## 2024-04-04 DIAGNOSIS — R1031 Right lower quadrant pain: Secondary | ICD-10-CM

## 2024-04-04 DIAGNOSIS — N921 Excessive and frequent menstruation with irregular cycle: Secondary | ICD-10-CM

## 2024-04-04 DIAGNOSIS — Z3200 Encounter for pregnancy test, result unknown: Secondary | ICD-10-CM

## 2024-05-09 ENCOUNTER — Ambulatory Visit: Payer: Self-pay | Admitting: Licensed Practical Nurse

## 2024-05-13 ENCOUNTER — Other Ambulatory Visit: Payer: Self-pay

## 2024-05-13 DIAGNOSIS — K5000 Crohn's disease of small intestine without complications: Secondary | ICD-10-CM

## 2024-05-17 ENCOUNTER — Ambulatory Visit
Admission: RE | Admit: 2024-05-17 | Discharge: 2024-05-17 | Disposition: A | Discharge status: Home / Self Care | Source: Ambulatory Visit

## 2024-05-17 DIAGNOSIS — K5000 Crohn's disease of small intestine without complications: Secondary | ICD-10-CM | POA: Diagnosis present

## 2024-06-19 ENCOUNTER — Ambulatory Visit: Admitting: Licensed Practical Nurse

## 2024-10-21 ENCOUNTER — Other Ambulatory Visit: Payer: Self-pay | Admitting: Licensed Practical Nurse

## 2024-11-28 ENCOUNTER — Ambulatory Visit: Admitting: Licensed Practical Nurse
